# Patient Record
Sex: Male | Born: 1975
Health system: Southern US, Community
[De-identification: ages and names within clinical notes are randomized; demographics above are authoritative.]

## PROBLEM LIST (undated history)

## (undated) DIAGNOSIS — J45909 Unspecified asthma, uncomplicated: Secondary | ICD-10-CM

## (undated) DIAGNOSIS — E119 Type 2 diabetes mellitus without complications: Secondary | ICD-10-CM

## (undated) HISTORY — DX: Type 2 diabetes mellitus without complications: E11.9

---

## 2007-12-30 ENCOUNTER — Emergency Department: Payer: Self-pay | Admitting: Emergency Medicine

## 2010-01-14 ENCOUNTER — Emergency Department: Payer: Self-pay | Admitting: Emergency Medicine

## 2015-11-11 ENCOUNTER — Encounter: Payer: Self-pay | Admitting: Emergency Medicine

## 2015-11-11 ENCOUNTER — Emergency Department
Admission: EM | Admit: 2015-11-11 | Discharge: 2015-11-11 | Disposition: A | Discharge status: Home / Self Care | Payer: 59 | Attending: Student | Admitting: Student

## 2015-11-11 ENCOUNTER — Emergency Department: Payer: 59

## 2015-11-11 DIAGNOSIS — L0201 Cutaneous abscess of face: Secondary | ICD-10-CM | POA: Diagnosis not present

## 2015-11-11 DIAGNOSIS — J45909 Unspecified asthma, uncomplicated: Secondary | ICD-10-CM | POA: Diagnosis not present

## 2015-11-11 DIAGNOSIS — L03213 Periorbital cellulitis: Secondary | ICD-10-CM | POA: Insufficient documentation

## 2015-11-11 DIAGNOSIS — F17210 Nicotine dependence, cigarettes, uncomplicated: Secondary | ICD-10-CM | POA: Diagnosis not present

## 2015-11-11 DIAGNOSIS — R220 Localized swelling, mass and lump, head: Secondary | ICD-10-CM | POA: Diagnosis not present

## 2015-11-11 DIAGNOSIS — H5711 Ocular pain, right eye: Secondary | ICD-10-CM | POA: Diagnosis present

## 2015-11-11 DIAGNOSIS — H05011 Cellulitis of right orbit: Secondary | ICD-10-CM | POA: Diagnosis not present

## 2015-11-11 HISTORY — DX: Unspecified asthma, uncomplicated: J45.909

## 2015-11-11 LAB — BASIC METABOLIC PANEL
Anion gap: 4 — ABNORMAL LOW (ref 5–15)
BUN: 13 mg/dL (ref 6–20)
CALCIUM: 9.4 mg/dL (ref 8.9–10.3)
CO2: 29 mmol/L (ref 22–32)
CREATININE: 1.29 mg/dL — AB (ref 0.61–1.24)
Chloride: 103 mmol/L (ref 101–111)
GFR calc Af Amer: >60 mL/min (ref >60)
GFR calc non Af Amer: >60 mL/min (ref >60)
GLUCOSE: 82 mg/dL (ref 65–99)
Potassium: 3.9 mmol/L (ref 3.5–5.1)
Sodium: 136 mmol/L (ref 135–145)

## 2015-11-11 LAB — CBC
HEMATOCRIT: 43.1 % (ref 40.0–52.0)
Hemoglobin: 14.8 g/dL (ref 13.0–18.0)
MCH: 31.2 pg (ref 26.0–34.0)
MCHC: 34.5 g/dL (ref 32.0–36.0)
MCV: 90.4 fL (ref 80.0–100.0)
Platelets: 267 10*3/uL (ref 150–440)
RBC: 4.76 MIL/uL (ref 4.40–5.90)
RDW: 14.6 % — AB (ref 11.5–14.5)
WBC: 8.9 10*3/uL (ref 3.8–10.6)

## 2015-11-11 MED ORDER — CLINDAMYCIN HCL 150 MG PO CAPS: 300.0000 mg | ORAL_CAPSULE | Freq: Three times a day (TID) | ORAL | Status: AC

## 2015-11-11 MED ORDER — IOPAMIDOL (ISOVUE-300) INJECTION 61%
75.0000 mL | Freq: Once | INTRAVENOUS | Status: AC | PRN
  Administered 2015-11-11: 75 mL via INTRAVENOUS
  Filled 2015-11-11: qty 75

## 2015-11-11 MED ORDER — HYDROCODONE-ACETAMINOPHEN 5-325 MG PO TABS
1.0000 | ORAL_TABLET | ORAL | Status: AC
  Administered 2015-11-11: 1 via ORAL
  Filled 2015-11-11: qty 1

## 2015-11-11 MED ORDER — HYDROCODONE-ACETAMINOPHEN 5-325 MG PO TABS: 1.0000 | ORAL_TABLET | Freq: Four times a day (QID) | ORAL | Status: DC | PRN

## 2015-11-11 MED ORDER — CLINDAMYCIN PHOSPHATE 600 MG/50ML IV SOLN
600.0000 mg | Freq: Once | INTRAVENOUS | Status: AC
  Administered 2015-11-11: 600 mg via INTRAVENOUS
  Filled 2015-11-11: qty 50

## 2015-11-11 NOTE — ED Notes (Signed)
Pt presents today with swelling to R eye. Pt states swelling started on Friday. Pt states he tried to pop the cyst developing on eye lid and was successful in getting some pus out. Redness noted at this time. PT alert and oriented, no acute distress noted at this time.

## 2015-11-11 NOTE — ED Provider Notes (Signed)
CSN: [id number]     Arrival date & time 11/11/15  1250 History   First MD Initiated Contact with Patient 11/11/15 1348     Chief Complaint  Patient presents with  . Facial Swelling     (Consider location/radiation/quality/duration/timing/severity/associated sxs/prior Treatment) HPI  40 year old male presents to emergency department for evaluation of right eye pain and swelling. Patient states he developed a small pimple above his right eyelid on Friday. He noticed mild pain and swelling. Last night he noticed some pus draining from the right eyebrow. This morning pain was 7 out of 10, he had redness and swelling throughout the right eye, difficulty opening his eyelids. He denies any eyeball pain or pain with extraocular movement of the eye. He has not had any fevers. No headaches or neck pain nausea or vomiting. He has not taken any medications for pain. Denies any vision changes.  Past Medical History  Diagnosis Date  . Asthma    History reviewed. No pertinent past surgical history. History reviewed. No pertinent family history. Social History  Substance Use Topics  . Smoking status: Current Every Day Smoker -- 2.00 packs/day    Types: Cigarettes  . Smokeless tobacco: Never Used  . Alcohol Use: Yes     Comment: Occ    Review of Systems  Constitutional: Negative.  Negative for fever, chills, activity change and appetite change.  HENT: Negative for congestion, ear pain, mouth sores, rhinorrhea, sinus pressure, sore throat and trouble swallowing.   Eyes: Negative for photophobia, pain (pain around the eye, no orbital pain), discharge and visual disturbance.  Respiratory: Negative for cough, chest tightness and shortness of breath.   Cardiovascular: Negative for chest pain and leg swelling.  Gastrointestinal: Negative for nausea, vomiting, abdominal pain, diarrhea and abdominal distention.  Genitourinary: Negative for dysuria and difficulty urinating.  Musculoskeletal: Negative for  back pain, arthralgias and gait problem.  Skin: Positive for wound. Negative for color change and rash.  Neurological: Negative for dizziness and headaches.  Hematological: Negative for adenopathy.  Psychiatric/Behavioral: Negative for behavioral problems and agitation.      Allergies  Other  Home Medications   Prior to Admission medications   Medication Sig Start Date End Date Taking? Authorizing Provider  clindamycin (CLEOCIN) 150 MG capsule Take 2 capsules (300 mg total) by mouth 3 (three) times daily. 11/11/15 11/21/15  Evon Slackhomas C Gaines, PA-C  HYDROcodone-acetaminophen (NORCO) 5-325 MG tablet Take 1 tablet by mouth every 6 (six) hours as needed for moderate pain. 11/11/15   Evon Slackhomas C Gaines, PA-C   BP 146/98 mmHg  Pulse 77  Temp(Src) 98.6 F (37 C) (Oral)  Resp 16  Ht 5\' 6"  (1.676 m)  Wt 108.863 kg  BMI 38.76 kg/m2  SpO2 96% Physical Exam  Constitutional: He is oriented to person, place, and time. He appears well-developed and well-nourished.  HENT:  Head: Normocephalic and atraumatic.  Right Ear: External ear normal.  Left Ear: External ear normal.  Nose: Nose normal.  Mouth/Throat: Oropharynx is clear and moist. No oropharyngeal exudate.  Eyes: Conjunctivae and EOM are normal. Pupils are equal, round, and reactive to light. Right eye exhibits no discharge. Left eye exhibits no discharge. No scleral icterus.  Examination of the right eye shows patient has a small abscess along the right eyebrow. This is tracking down around the upper eyelid with induration. No fluctuance. There is soft tissue swelling along the lower eyelid. No drainage. Minimally tender to palpation along the eyebrow.  Neck: Normal range of motion. Neck  supple.  Cardiovascular: Normal rate, regular rhythm, normal heart sounds and intact distal pulses.   Pulmonary/Chest: Effort normal and breath sounds normal. No respiratory distress. He has no wheezes. He has no rales. He exhibits no tenderness.  Abdominal:  Soft. Bowel sounds are normal. He exhibits no distension. There is no tenderness.  Musculoskeletal: Normal range of motion. He exhibits no edema or tenderness.  Neurological: He is alert and oriented to person, place, and time.  Skin: Skin is warm and dry.  Psychiatric: He has a normal mood and affect. His behavior is normal. Judgment and thought content normal.    ED Course  Procedures (including critical care time) Scab along the right eyebrow was deroofed with a 20-gauge needle. After scab was removed, purulent drainage was expressed from the wound.  Labs Review Labs Reviewed  CBC - Abnormal; Notable for the following:    RDW 14.6 (*)    All other components within normal limits  BASIC METABOLIC PANEL - Abnormal; Notable for the following:    Creatinine, Ser 1.29 (*)    Anion gap 4 (*)    All other components within normal limits    Imaging Review Ct Orbits W/cm  11/11/2015  CLINICAL DATA:  Right eye swelling 2 days.  Popped cyst on eyelid. EXAM: CT ORBITS WITH CONTRAST TECHNIQUE: Multidetector CT imaging of the orbits was performed following the bolus administration of intravenous contrast. CONTRAST:  75mL ISOVUE-300 IOPAMIDOL (ISOVUE-300) INJECTION 61% COMPARISON:  None. FINDINGS: The globes are normal and symmetric. Retrobulbar spaces are normal. There is increased density over the right periorbital subcutaneous fat which may be due to edema versus periorbital cellulitis. No postseptal abnormality. Paranasal sinuses demonstrate hypoplastic frontal sinus with mild mucosal membrane thickening over the floor the maxillary sinuses and mucous retention cyst over the sphenoid sinus. Minimal deviation of the nasal septum to the right. Remaining bony structures are within normal. Parotid glands are normal and symmetric. Remaining soft tissues are within normal. IMPRESSION: Right periorbital soft tissue swelling which may be due to edema versus periorbital cellulitis. No evidence of postseptal  involvement. Globes and retrobulbar spaces are normal and symmetric. Minimal chronic sinus inflammatory disease. Electronically Signed   By: Elberta Fortis M.D.   On: 11/11/2015 15:39   I have personally reviewed and evaluated these images and lab results as part of my medical decision-making.   EKG Interpretation None      MDM   Final diagnoses:  Periorbital cellulitis of right eye  Abscess of face    40 year old with right periorbital cellulitis. He has a small abscess above the right eye along the eyebrow that was due to rheumatoid with a needle and purulent drainage was expressed. Patient with normal vital signs and labs. CT of the orbits confirm no orbital cellulitis. Patient is given IV clindamycin 600 mg, treated with a 10 day course of oral Clinda 300 mg 3 times a day for 10 days. He is educated on signs and symptoms to return to the emergency department for.    Evon Slack, PA-C 11/11/15 1603  Gayla Doss, MD 11/11/15 [phone]

## 2015-11-11 NOTE — ED Notes (Signed)
Discussed discharge instructions, prescriptions, and follow-up care with patient. No questions or concerns at this time. Pt stable at discharge.  

## 2015-11-11 NOTE — Discharge Instructions (Signed)
Abscess An abscess is an infected area that contains a collection of pus and debris.It can occur in almost any part of the body. An abscess is also known as a furuncle or boil. CAUSES  An abscess occurs when tissue gets infected. This can occur from blockage of oil or sweat glands, infection of hair follicles, or a minor injury to the skin. As the body tries to fight the infection, pus collects in the area and creates pressure under the skin. This pressure causes pain. People with weakened immune systems have difficulty fighting infections and get certain abscesses more often.  SYMPTOMS Usually an abscess develops on the skin and becomes a painful mass that is red, warm, and tender. If the abscess forms under the skin, you may feel a moveable soft area under the skin. Some abscesses break open (rupture) on their own, but most will continue to get worse without care. The infection can spread deeper into the body and eventually into the bloodstream, causing you to feel ill.  DIAGNOSIS  Your caregiver will take your medical history and perform a physical exam. A sample of fluid may also be taken from the abscess to determine what is causing your infection. TREATMENT  Your caregiver may prescribe antibiotic medicines to fight the infection. However, taking antibiotics alone usually does not cure an abscess. Your caregiver may need to make a small cut (incision) in the abscess to drain the pus. In some cases, gauze is packed into the abscess to reduce pain and to continue draining the area. HOME CARE INSTRUCTIONS   Only take over-the-counter or prescription medicines for pain, discomfort, or fever as directed by your caregiver.  If you were prescribed antibiotics, take them as directed. Finish them even if you start to feel better.  If gauze is used, follow your caregiver's directions for changing the gauze.  To avoid spreading the infection:  Keep your draining abscess covered with a  bandage.  Wash your hands well.  Do not share personal care items, towels, or whirlpools with others.  Avoid skin contact with others.  Keep your skin and clothes clean around the abscess.  Keep all follow-up appointments as directed by your caregiver. SEEK MEDICAL CARE IF:   You have increased pain, swelling, redness, fluid drainage, or bleeding.  You have muscle aches, chills, or a general ill feeling.  You have a fever. MAKE SURE YOU:   Understand these instructions.  Will watch your condition.  Will get help right away if you are not doing well or get worse.   This information is not intended to replace advice given to you by your health care provider. Make sure you discuss any questions you have with your health care provider.   Document Released: 05/07/2005 Document Revised: 01/27/2012 Document Reviewed: 10/10/2011 Elsevier Interactive Patient Education 2016 [employer] Preseptal Cellulitis, Adult    Preseptal cellulitis--also called periorbital cellulitis--is an infection that can affect your eyelid and the soft tissues or skin that surround your eye. The infection may also affect the structures that produce and drain your tears. It does not affect your eye itself.  CAUSES  This condition may be caused by:  Bacterial infection.  Long-term (chronic) sinus infections.  An object (foreign body) that is stuck behind the eye.  An injury that:  Goes through the eyelid tissues.  Causes an infection, such as an insect sting. Fracture of the bone around the eye.  Infections that have spread from the eyelid or other structures around the  eye.  Bite wounds.  Inflammation or infection of the lining membranes of the brain (meningitis).  An infection in the blood (septicemia).  Dental infection (abscess).  Viral infection. This is rare. RISK FACTORS  Risk factors for preseptal cellulitis include:  Participating in activities that increase your risk of trauma to the face  or head, such as boxing or high-speed activities.  Having a weakened defense system (immune system).  Medical conditions, such as nasal polyps, that increase your risk for frequent or recurrent sinus infections.  Not receiving regular dental care. SYMPTOMS  Symptoms of this condition usually come on suddenly. Symptoms may include:  Red, hot, and swollen eyelids.  Fever.  Difficulty opening your eye.  Eye pain. DIAGNOSIS  This condition may be diagnosed by an eye exam. You may also have tests, such as:  Blood tests.  CT scan.  MRI.  Spinal tap (lumbar puncture). This is a procedure that involves removing and examining a small amount of the fluid that surrounds the brain and spinal cord. This checks for meningitis. TREATMENT  Treatment for this condition will include antibiotic medicines. These may be given by mouth (orally), through an IV, or as a shot. Your health care provider may also recommend nasal decongestants to reduce swelling.  HOME CARE INSTRUCTIONS  Take your antibiotic medicine as directed by your health care provider. Finish all of it even if you start to feel better.  Take medicines only as directed by your health care provider.  Drink enough fluid to keep your urine clear or pale yellow.  Do not use any tobacco products, including cigarettes, chewing tobacco, or electronic cigarettes. If you need help quitting, ask your health care provider.  Keep all follow-up visits as directed by your health care provider. These include any visits with an eye specialist (ophthalmologist) or dentist. SEEK MEDICAL CARE IF:  You have a fever.  Your eyelids become more red, warm, or swollen.  You have new symptoms.  Your symptoms do not get better with treatment. SEEK IMMEDIATE MEDICAL CARE IF:  You develop double vision, or your vision becomes blurred or worsens in any way.  You have trouble moving your eyes.  Your eye looks like it is sticking out or bulging out (proptosis).  You  develop a severe headache, severe neck pain, or neck stiffness.  You develop repeated vomiting. This information is not intended to replace advice given to you by your health care provider. Make sure you discuss any questions you have with your health care provider.  Document Released: 08/30/2010 Document Revised: 12/12/2014 Document Reviewed: 07/24/2014  Elsevier Interactive Patient Education [employer]

## 2015-11-13 DIAGNOSIS — L03213 Periorbital cellulitis: Secondary | ICD-10-CM | POA: Diagnosis not present

## 2015-12-24 ENCOUNTER — Emergency Department: Payer: 59

## 2015-12-24 ENCOUNTER — Encounter: Payer: Self-pay | Admitting: Medical Oncology

## 2015-12-24 ENCOUNTER — Emergency Department
Admission: EM | Admit: 2015-12-24 | Discharge: 2015-12-24 | Disposition: A | Discharge status: Home / Self Care | Payer: 59 | Attending: Emergency Medicine | Admitting: Emergency Medicine

## 2015-12-24 DIAGNOSIS — F17210 Nicotine dependence, cigarettes, uncomplicated: Secondary | ICD-10-CM | POA: Diagnosis not present

## 2015-12-24 DIAGNOSIS — R05 Cough: Secondary | ICD-10-CM | POA: Diagnosis not present

## 2015-12-24 DIAGNOSIS — J9801 Acute bronchospasm: Secondary | ICD-10-CM | POA: Insufficient documentation

## 2015-12-24 MED ORDER — IPRATROPIUM-ALBUTEROL 0.5-2.5 (3) MG/3ML IN SOLN
3.0000 mL | Freq: Once | RESPIRATORY_TRACT | Status: AC
  Administered 2015-12-24: 3 mL via RESPIRATORY_TRACT
  Filled 2015-12-24: qty 3

## 2015-12-24 MED ORDER — IPRATROPIUM-ALBUTEROL 0.5-2.5 (3) MG/3ML IN SOLN
RESPIRATORY_TRACT | Status: AC
  Filled 2015-12-24: qty 3

## 2015-12-24 MED ORDER — METHYLPREDNISOLONE SODIUM SUCC 125 MG IJ SOLR
125.0000 mg | Freq: Once | INTRAMUSCULAR | Status: AC
  Administered 2015-12-24: 125 mg via INTRAMUSCULAR
  Filled 2015-12-24: qty 2

## 2015-12-24 MED ORDER — HYDROCOD POLST-CPM POLST ER 10-8 MG/5ML PO SUER
5.0000 mL | Freq: Once | ORAL | Status: AC
  Administered 2015-12-24: 5 mL via ORAL
  Filled 2015-12-24: qty 5

## 2015-12-24 MED ORDER — IPRATROPIUM-ALBUTEROL 0.5-2.5 (3) MG/3ML IN SOLN
3.0000 mL | Freq: Once | RESPIRATORY_TRACT | Status: AC
  Administered 2015-12-24: 3 mL via RESPIRATORY_TRACT

## 2015-12-24 MED ORDER — BENZONATATE 100 MG PO CAPS: 200.0000 mg | ORAL_CAPSULE | Freq: Three times a day (TID) | ORAL | Status: AC | PRN

## 2015-12-24 MED ORDER — METHYLPREDNISOLONE 4 MG PO TBPK: ORAL_TABLET | ORAL | Status: DC

## 2015-12-24 NOTE — Discharge Instructions (Signed)
Bronchospasm, Adult A bronchospasm is when the tubes that carry air in and out of your lungs (airways) spasm or tighten. During a bronchospasm it is hard to breathe. This is because the airways get smaller. A bronchospasm can be triggered by:  Allergies. These may be to animals, pollen, food, or mold.  Infection. This is a common cause of bronchospasm.  Exercise.  Irritants. These include pollution, cigarette smoke, strong odors, aerosol sprays, and paint fumes.  Weather changes.  Stress.  Being emotional. HOME CARE   Always have a plan for getting help. Know when to call your doctor and local emergency services (911 in the [place] Know where you can get emergency care.  Only take medicines as told by your doctor.  If you were prescribed an inhaler or nebulizer machine, ask your doctor how to use it correctly. Always use a spacer with your inhaler if you were given one.  Stay calm during an attack. Try to relax and breathe more slowly.  Control your home environment:  Change your heating and air conditioning filter at least once a month.  Limit your use of fireplaces and wood stoves.  Do not  smoke. Do not  allow smoking in your home.  Avoid perfumes and fragrances.  Get rid of pests (such as roaches and mice) and their droppings.  Throw away plants if you see mold on them.  Keep your house clean and dust free.  Replace carpet with wood, tile, or vinyl flooring. Carpet can trap dander and dust.  Use allergy-proof pillows, mattress covers, and box spring covers.  Wash bed sheets and blankets every week in hot water. Dry them in a dryer.  Use blankets that are made of polyester or cotton.  Wash hands frequently. GET HELP IF:  You have muscle aches.  You have chest pain.  The thick spit you spit or cough up (sputum) changes from clear or white to yellow, green, gray, or bloody.  The thick spit you spit or cough up gets thicker.  There are problems that may be  related to the medicine you are given such as:  A rash.  Itching.  Swelling.  Trouble breathing. GET HELP RIGHT AWAY IF:  You feel you cannot breathe or catch your breath.  You cannot stop coughing.  Your treatment is not helping you breathe better.  You have very bad chest pain. MAKE SURE YOU:   Understand these instructions.  Will watch your condition.  Will get help right away if you are not doing well or get worse.   This information is not intended to replace advice given to you by your health care provider. Make sure you discuss any questions you have with your health care provider.   Document Released: 05/25/2009 Document Revised: 08/18/2014 Document Reviewed: 01/18/2013 Elsevier Interactive Patient Education 2016 Elsevier Inc.  

## 2015-12-24 NOTE — ED Notes (Signed)
Pt reports cough, congestion and wheezing x 1 week. Hx of asthma, inhalers and nebs not working at home.

## 2015-12-24 NOTE — ED Notes (Signed)
States she developed cough and some wheezing for couple of days  Has used inhaler and SVN at home w/o relief  Denies any fever or chills

## 2015-12-24 NOTE — ED Provider Notes (Signed)
[facility] Emergency Department Provider Note   ____________________________________________  Time seen: Approximately 3:00 PM  I have reviewed the triage vital signs and the nursing notes.   HISTORY  Chief Complaint Cough and Asthma    HPI Buford DresserJermaine Gilland is a 40 y.o. male patient complaining of cough and wheezing for one week. Basic history of asthma which has not been relieved by his inhaler. Patient state he uses child's nebulizer machine and doses last night with only mild relief. Patient denies any other URI signs symptoms. Patient denies any nausea vomiting diarrhea.Patient was given nebulized treatment in triage which has not improved his complaint.   Past Medical History  Diagnosis Date  . Asthma     There are no active problems to display for this patient.   History reviewed. No pertinent past surgical history.  Current Outpatient Rx  Name  Route  Sig  Dispense  Refill  . albuterol (ACCUNEB) 1.25 MG/3ML nebulizer solution   Nebulization   Take 1 ampule by nebulization every 6 (six) hours as needed for wheezing.         Marland Kitchen. albuterol (PROVENTIL HFA;VENTOLIN HFA) 108 (90 Base) MCG/ACT inhaler   Inhalation   Inhale 2 puffs into the lungs every 6 (six) hours as needed for wheezing or shortness of breath.         . benzonatate (TESSALON PERLES) 100 MG capsule   Oral   Take 2 capsules (200 mg total) by mouth 3 (three) times daily as needed for cough.   30 capsule   0   . HYDROcodone-acetaminophen (NORCO) 5-325 MG tablet   Oral   Take 1 tablet by mouth every 6 (six) hours as needed for moderate pain.   15 tablet   0   . methylPREDNISolone (MEDROL DOSEPAK) 4 MG TBPK tablet      Take Tapered dose as directed   21 tablet   0     Allergies Other  No family history on file.  Social History Social History  Substance Use Topics  . Smoking status: Current Every Day Smoker -- 2.00 packs/day    Types: Cigarettes  .  Smokeless tobacco: Never Used  . Alcohol Use: Yes     Comment: Occ    Review of Systems Constitutional: No fever/chills Eyes: No visual changes. ENT: No sore throat. Cardiovascular: Denies chest pain. Respiratory: Denies shortness of breath.Cough and wheezing Gastrointestinal: No abdominal pain.  No nausea, no vomiting.  No diarrhea.  No constipation. Genitourinary: Negative for dysuria. Musculoskeletal: Negative for back pain. Skin: Negative for rash. Neurological: Negative for headaches, focal weakness or numbness.    ____________________________________________   PHYSICAL EXAM:  VITAL SIGNS: ED Triage Vitals  Enc Vitals Group     BP 12/24/15 1224 157/95 mmHg     Pulse Rate 12/24/15 1224 112     Resp 12/24/15 1224 20     Temp 12/24/15 1224 98.1 F (36.7 C)     Temp Source 12/24/15 1224 Oral     SpO2 12/24/15 1224 95 %     Weight 12/24/15 1224 240 lb (108.863 kg)     Height 12/24/15 1224 5\' 6"  (1.676 m)     Head Cir --      Peak Flow --      Pain Score 12/24/15 1224 0     Pain Loc --      Pain Edu? --      Excl. in GC? --     Constitutional: Alert and  oriented. Well appearing and in no acute distress. Eyes: Conjunctivae are normal. PERRL. EOMI. Head: Atraumatic. Nose: No congestion/rhinnorhea. Mouth/Throat: Mucous membranes are moist.  Oropharynx non-erythematous. Neck: No stridor. No cervical spine tenderness to palpation. Hematological/Lymphatic/Immunilogical: No cervical lymphadenopathy. Cardiovascular: Normal rate, regular rhythm. Grossly normal heart sounds.  Good peripheral circulation.Tachycardic Respiratory: Normal respiratory effort.  No retractions. Lungs bilateral expiratory wheezing Gastrointestinal: Soft and nontender. No distention. No abdominal bruits. No CVA tenderness. Musculoskeletal: No lower extremity tenderness nor edema.  No joint effusions. Neurologic:  Normal speech and language. No gross focal neurologic deficits are appreciated. No  gait instability. Skin:  Skin is warm, dry and intact. No rash noted. Psychiatric: Mood and affect are normal. Speech and behavior are normal.  ____________________________________________   LABS (all labs ordered are listed, but only abnormal results are displayed)  Labs Reviewed - No data to display ____________________________________________  EKG  Rib heart station Dr. no acute findings.  ____________________________________________  RADIOLOGY  No acute findings on chest x-ray ____________________________________________   PROCEDURES  Procedure(s) performed: None  Critical Care performed: No  ____________________________________________   INITIAL IMPRESSION / ASSESSMENT AND PLAN / ED COURSE  Pertinent labs & imaging results that were available during my care of the patient were reviewed by me and considered in my medical decision making (see chart for details).  Cough due to bronchospasm. Patient given discharge care instructions. Patient given prescription for Motrin dosepak and Tessalon Perles. Patient [facility]. ____________________________________________   FINAL CLINICAL IMPRESSION(S) / ED DIAGNOSES  Final diagnoses:  Cough due to bronchospasm      NEW MEDICATIONS STARTED DURING THIS VISIT:  New Prescriptions   BENZONATATE (TESSALON PERLES) 100 MG CAPSULE    Take 2 capsules (200 mg total) by mouth 3 (three) times daily as needed for cough.   METHYLPREDNISOLONE (MEDROL DOSEPAK) 4 MG TBPK TABLET    Take Tapered dose as directed     Note:  This document was prepared using Dragon voice recognition software and may include unintentional dictation errors.    Joni Reining, PA-C 12/24/15 1555  Joni Reining, PA-C 12/24/15 1556  Phineas Semen, MD 12/24/15 [phone]

## 2017-10-23 ENCOUNTER — Emergency Department: Payer: 59

## 2017-10-23 ENCOUNTER — Emergency Department
Admission: EM | Admit: 2017-10-23 | Discharge: 2017-10-23 | Disposition: A | Discharge status: Home / Self Care | Payer: 59 | Attending: Emergency Medicine | Admitting: Emergency Medicine

## 2017-10-23 ENCOUNTER — Encounter: Payer: Self-pay | Admitting: Emergency Medicine

## 2017-10-23 ENCOUNTER — Other Ambulatory Visit: Payer: Self-pay

## 2017-10-23 DIAGNOSIS — J9801 Acute bronchospasm: Secondary | ICD-10-CM | POA: Diagnosis not present

## 2017-10-23 DIAGNOSIS — F17210 Nicotine dependence, cigarettes, uncomplicated: Secondary | ICD-10-CM | POA: Diagnosis not present

## 2017-10-23 DIAGNOSIS — R05 Cough: Secondary | ICD-10-CM | POA: Diagnosis not present

## 2017-10-23 MED ORDER — BENZONATATE 100 MG PO CAPS: 200.0000 mg | ORAL_CAPSULE | Freq: Three times a day (TID) | ORAL | 0 refills | Status: AC | PRN

## 2017-10-23 MED ORDER — METHYLPREDNISOLONE 4 MG PO TBPK: ORAL_TABLET | ORAL | 0 refills | Status: DC

## 2017-10-23 MED ORDER — NAPROXEN 500 MG PO TABS
500.0000 mg | ORAL_TABLET | Freq: Once | ORAL | Status: AC
  Administered 2017-10-23: 500 mg via ORAL
  Filled 2017-10-23: qty 1

## 2017-10-23 MED ORDER — HYDROCOD POLST-CPM POLST ER 10-8 MG/5ML PO SUER
5.0000 mL | Freq: Once | ORAL | Status: AC
  Administered 2017-10-23: 5 mL via ORAL
  Filled 2017-10-23: qty 5

## 2017-10-23 MED ORDER — HYDROCOD POLST-CPM POLST ER 10-8 MG/5ML PO SUER: 5.0000 mL | Freq: Every evening | ORAL | 0 refills | Status: DC | PRN

## 2017-10-23 NOTE — ED Triage Notes (Addendum)
Presents with cough for couple of days  States cough is prod now  Coughing up yellow/green phlegm   Denies any fever  Having increased pain in chest pain

## 2017-10-23 NOTE — ED Provider Notes (Signed)
[facility] Emergency Department Provider Note   ____________________________________________   First MD Initiated Contact with Patient 10/23/17 [phone]     (approximate)  I have reviewed the triage vital signs and the nursing notes.   HISTORY  Chief Complaint Cough    HPI Austin Serrano is a 42 y.o. male patient complaining of productive cough for 3 days.  Patient state yellow greenish sputum.  Patient state no relief from cough and wheezing with inhaler.  Patient denies nausea, vomiting, diarrhea.  Patient state body secondary to continual cough.  Patient has taken a flu shot this season.  Patient rates his pain discomfort as a 9/10.  Patient describes his pain is "aching".  Past Medical History:  Diagnosis Date  . Asthma     There are no active problems to display for this patient.   History reviewed. No pertinent surgical history.  Prior to Admission medications   Medication Sig Start Date End Date Taking? Authorizing Provider  albuterol (ACCUNEB) 1.25 MG/3ML nebulizer solution Take 1 ampule by nebulization every 6 (six) hours as needed for wheezing.    [provider]  albuterol (PROVENTIL HFA;VENTOLIN HFA) 108 (90 Base) MCG/ACT inhaler Inhale 2 puffs into the lungs every 6 (six) hours as needed for wheezing or shortness of breath.    [provider]  benzonatate (TESSALON PERLES) 100 MG capsule Take 2 capsules (200 mg total) by mouth 3 (three) times daily as needed. 10/23/17 10/23/18  Joni ReiningSmith, Tyja Gortney K, PA-C  chlorpheniramine-HYDROcodone (TUSSIONEX PENNKINETIC ER) 10-8 MG/5ML SUER Take 5 mLs by mouth at bedtime as needed for cough. 10/23/17   Joni ReiningSmith, Maryruth Apple K, PA-C  HYDROcodone-acetaminophen (NORCO) 5-325 MG tablet Take 1 tablet by mouth every 6 (six) hours as needed for moderate pain. 11/11/15   Evon SlackGaines, Thomas C, PA-C  methylPREDNISolone (MEDROL DOSEPAK) 4 MG TBPK tablet Take Tapered dose as directed 12/24/15   Joni ReiningSmith, Nollie Terlizzi K, PA-C    methylPREDNISolone (MEDROL DOSEPAK) 4 MG TBPK tablet Take Tapered dose as directed 10/23/17   Joni ReiningSmith, Rayshard Schirtzinger K, PA-C    Allergies Other  No family history on file.  Social History Social History   Tobacco Use  . Smoking status: Current Every Day Smoker    Packs/day: 2.00    Types: Cigarettes  . Smokeless tobacco: Never Used  Substance Use Topics  . Alcohol use: Yes    Comment: Occ  . Drug use: No    Review of Systems Constitutional: No fever/chills Eyes: No visual changes. ENT: No sore throat. Cardiovascular: Denies chest pain. Respiratory: Wheezing, shortness of breath, and productive cough. Gastrointestinal: No abdominal pain.  No nausea, no vomiting.  No diarrhea.  No constipation. Genitourinary: Negative for dysuria. Musculoskeletal: Negative for back pain. Skin: Negative for rash. Neurological: Negative for headaches, focal weakness or numbness.   ____________________________________________   PHYSICAL EXAM:  VITAL SIGNS: ED Triage Vitals  Enc Vitals Group     BP 10/23/17 0804 111/80     Pulse Rate 10/23/17 0804 100     Resp 10/23/17 0804 16     Temp --      Temp Source 10/23/17 0804 Oral     SpO2 10/23/17 0804 99 %     Weight 10/23/17 0806 240 lb (108.9 kg)     Height 10/23/17 0806 5\' 7"  (1.702 m)     Head Circumference --      Peak Flow --      Pain Score 10/23/17 0805 10     Pain Loc --  Pain Edu? --      Excl. in GC? --    Constitutional: Alert and oriented. Well appearing and in no acute distress. Nose: Nasal congestion and rhinorrhea. Mouth/Throat: Mucous membranes are moist.  Oropharynx non-erythematous. Neck: No stridor. Hematological/Lymphatic/Immunilogical: No cervical lymphadenopathy. Cardiovascular: Normal rate, regular rhythm. Grossly normal heart sounds.  Good peripheral circulation. Respiratory: Normal respiratory effort.  No retractions. Lungs with rhonch/wheezing, i and continual productive cough.  Musculoskeletal: No lower  extremity tenderness nor edema.  No joint effusions. Neurologic:  Normal speech and language. No gross focal neurologic deficits are appreciated. No gait instability. Skin:  Skin is warm, dry and intact. No rash noted. Psychiatric: Mood and affect are normal. Speech and behavior are normal.  ____________________________________________   LABS (all labs ordered are listed, but only abnormal results are displayed)  Labs Reviewed - No data to display ____________________________________________  EKG   ____________________________________________  RADIOLOGY No acute findings on chest x-ray   Official radiology report(s): Dg Chest 2 View  Result Date: 10/23/2017 CLINICAL DATA:  Productive cough. EXAM: CHEST - 2 VIEW COMPARISON:  12/24/2015 FINDINGS: The heart size and mediastinal contours are within normal limits. Both lungs are clear. The visualized skeletal structures are unremarkable. IMPRESSION: No active cardiopulmonary disease. Electronically Signed   By: Marlan Palau M.D.   On: 10/23/2017 08:56    ____________________________________________   PROCEDURES  Procedure(s) performed: None  Procedures  Critical Care performed: No  ____________________________________________   INITIAL IMPRESSION / ASSESSMENT AND PLAN / ED COURSE  As part of my medical decision making, I reviewed the following data within the electronic MEDICAL RECORD NUMBER    Cough secondary to bronchospasms.  Discussed negative x-ray findings with patient.  Patient given discharge care instruction advised take medication as directed.  Patient advised to follow-up with PCP if as needed.      ____________________________________________   FINAL CLINICAL IMPRESSION(S) / ED DIAGNOSES  Final diagnoses:  Cough due to bronchospasm     ED Discharge Orders        Ordered    chlorpheniramine-HYDROcodone (TUSSIONEX PENNKINETIC ER) 10-8 MG/5ML SUER  At bedtime PRN     10/23/17 0908    benzonatate  (TESSALON PERLES) 100 MG capsule  3 times daily PRN     10/23/17 0908    methylPREDNISolone (MEDROL DOSEPAK) 4 MG TBPK tablet     10/23/17 0908       Note:  This document was prepared using Dragon voice recognition software and may include unintentional dictation errors.    Joni Reining, PA-C 10/23/17 4132    Jene Every, MD 10/23/17 1257

## 2018-04-02 ENCOUNTER — Ambulatory Visit: Payer: Self-pay | Admitting: Family Medicine

## 2018-08-11 DIAGNOSIS — R319 Hematuria, unspecified: Secondary | ICD-10-CM | POA: Diagnosis not present

## 2018-08-11 DIAGNOSIS — N390 Urinary tract infection, site not specified: Secondary | ICD-10-CM | POA: Diagnosis not present

## 2018-08-14 ENCOUNTER — Encounter: Payer: Self-pay | Admitting: Emergency Medicine

## 2018-08-14 DIAGNOSIS — R319 Hematuria, unspecified: Secondary | ICD-10-CM | POA: Diagnosis not present

## 2018-08-14 DIAGNOSIS — Z5321 Procedure and treatment not carried out due to patient leaving prior to being seen by health care provider: Secondary | ICD-10-CM | POA: Insufficient documentation

## 2018-08-14 DIAGNOSIS — F17210 Nicotine dependence, cigarettes, uncomplicated: Secondary | ICD-10-CM | POA: Diagnosis not present

## 2018-08-14 DIAGNOSIS — Z79899 Other long term (current) drug therapy: Secondary | ICD-10-CM | POA: Insufficient documentation

## 2018-08-14 DIAGNOSIS — N390 Urinary tract infection, site not specified: Secondary | ICD-10-CM | POA: Diagnosis not present

## 2018-08-14 DIAGNOSIS — J45909 Unspecified asthma, uncomplicated: Secondary | ICD-10-CM | POA: Insufficient documentation

## 2018-08-14 DIAGNOSIS — M545 Low back pain: Secondary | ICD-10-CM | POA: Diagnosis not present

## 2018-08-14 DIAGNOSIS — R309 Painful micturition, unspecified: Secondary | ICD-10-CM | POA: Diagnosis present

## 2018-08-14 DIAGNOSIS — R310 Gross hematuria: Secondary | ICD-10-CM | POA: Diagnosis not present

## 2018-08-14 LAB — URINALYSIS, COMPLETE (UACMP) WITH MICROSCOPIC
BACTERIA UA: NONE SEEN
Bilirubin Urine: NEGATIVE
Glucose, UA: NEGATIVE mg/dL
Ketones, ur: NEGATIVE mg/dL
Nitrite: NEGATIVE
Protein, ur: 100 mg/dL — AB
SPECIFIC GRAVITY, URINE: 1.02 (ref 1.005–1.030)
SQUAMOUS EPITHELIAL / LPF: NONE SEEN (ref 0–5)
pH: 6 (ref 5.0–8.0)

## 2018-08-14 LAB — BASIC METABOLIC PANEL
ANION GAP: 6 (ref 5–15)
BUN: 14 mg/dL (ref 6–20)
CALCIUM: 8.7 mg/dL — AB (ref 8.9–10.3)
CHLORIDE: 102 mmol/L (ref 98–111)
CO2: 27 mmol/L (ref 22–32)
Creatinine, Ser: 1.33 mg/dL — ABNORMAL HIGH (ref 0.61–1.24)
GLUCOSE: 176 mg/dL — AB (ref 70–99)
POTASSIUM: 3.9 mmol/L (ref 3.5–5.1)
SODIUM: 135 mmol/L (ref 135–145)

## 2018-08-14 LAB — CBC
HCT: 42.1 % (ref 39.0–52.0)
Hemoglobin: 13.9 g/dL (ref 13.0–17.0)
MCH: 29.8 pg (ref 26.0–34.0)
MCHC: 33 g/dL (ref 30.0–36.0)
MCV: 90.1 fL (ref 80.0–100.0)
PLATELETS: 326 10*3/uL (ref 150–400)
RBC: 4.67 MIL/uL (ref 4.22–5.81)
RDW: 14 % (ref 11.5–15.5)
WBC: 10 10*3/uL (ref 4.0–10.5)
nRBC: 0 % (ref 0.0–0.2)

## 2018-08-14 NOTE — ED Notes (Signed)
Patient up to stat desk inquiring about wait time, update given. 

## 2018-08-14 NOTE — ED Triage Notes (Signed)
Patient states that he started having pain with urination on 12/28 and [facility] and diagnosed with a UTI. Patient states that since that time he has had increase pain with urination and blood in his urine.

## 2018-08-15 ENCOUNTER — Emergency Department
Admission: EM | Admit: 2018-08-15 | Discharge: 2018-08-15 | Disposition: A | Discharge status: Home / Self Care | Payer: 59 | Attending: Emergency Medicine | Admitting: Emergency Medicine

## 2018-08-15 ENCOUNTER — Emergency Department
Admission: EM | Admit: 2018-08-15 | Discharge: 2018-08-15 | Disposition: A | Discharge status: Home / Self Care | Payer: 59 | Source: Home / Self Care | Attending: Emergency Medicine | Admitting: Emergency Medicine

## 2018-08-15 ENCOUNTER — Emergency Department: Payer: 59

## 2018-08-15 ENCOUNTER — Other Ambulatory Visit: Payer: Self-pay

## 2018-08-15 DIAGNOSIS — R319 Hematuria, unspecified: Secondary | ICD-10-CM | POA: Diagnosis not present

## 2018-08-15 DIAGNOSIS — Z79899 Other long term (current) drug therapy: Secondary | ICD-10-CM

## 2018-08-15 DIAGNOSIS — J45909 Unspecified asthma, uncomplicated: Secondary | ICD-10-CM

## 2018-08-15 DIAGNOSIS — N390 Urinary tract infection, site not specified: Secondary | ICD-10-CM

## 2018-08-15 DIAGNOSIS — F17210 Nicotine dependence, cigarettes, uncomplicated: Secondary | ICD-10-CM

## 2018-08-15 DIAGNOSIS — R310 Gross hematuria: Secondary | ICD-10-CM | POA: Diagnosis not present

## 2018-08-15 DIAGNOSIS — M545 Low back pain: Secondary | ICD-10-CM | POA: Diagnosis not present

## 2018-08-15 DIAGNOSIS — Z5321 Procedure and treatment not carried out due to patient leaving prior to being seen by health care provider: Secondary | ICD-10-CM | POA: Diagnosis not present

## 2018-08-15 LAB — URINALYSIS, COMPLETE (UACMP) WITH MICROSCOPIC
BACTERIA UA: NONE SEEN
BILIRUBIN URINE: NEGATIVE
Glucose, UA: NEGATIVE mg/dL
KETONES UR: NEGATIVE mg/dL
NITRITE: NEGATIVE
PH: 5 (ref 5.0–8.0)
Protein, ur: 30 mg/dL — AB
RBC / HPF: >50 RBC/hpf — ABNORMAL HIGH (ref 0–5)
SPECIFIC GRAVITY, URINE: 1.018 (ref 1.005–1.030)
Squamous Epithelial / LPF: NONE SEEN (ref 0–5)

## 2018-08-15 LAB — CBC WITH DIFFERENTIAL/PLATELET
ABS IMMATURE GRANULOCYTES: 0.06 10*3/uL (ref 0.00–0.07)
Basophils Absolute: 0 10*3/uL (ref 0.0–0.1)
Basophils Relative: 0 %
Eosinophils Absolute: 0.2 10*3/uL (ref 0.0–0.5)
Eosinophils Relative: 3 %
HCT: 40.6 % (ref 39.0–52.0)
Hemoglobin: 13.1 g/dL (ref 13.0–17.0)
Immature Granulocytes: 1 %
Lymphocytes Relative: 36 %
Lymphs Abs: 2.5 10*3/uL (ref 0.7–4.0)
MCH: 29.6 pg (ref 26.0–34.0)
MCHC: 32.3 g/dL (ref 30.0–36.0)
MCV: 91.6 fL (ref 80.0–100.0)
Monocytes Absolute: 0.6 10*3/uL (ref 0.1–1.0)
Monocytes Relative: 9 %
NEUTROS ABS: 3.5 10*3/uL (ref 1.7–7.7)
NEUTROS PCT: 51 %
NRBC: 0 % (ref 0.0–0.2)
Platelets: 284 10*3/uL (ref 150–400)
RBC: 4.43 MIL/uL (ref 4.22–5.81)
RDW: 14.2 % (ref 11.5–15.5)
WBC: 6.9 10*3/uL (ref 4.0–10.5)

## 2018-08-15 LAB — COMPREHENSIVE METABOLIC PANEL
ALT: 23 U/L (ref 0–44)
AST: 24 U/L (ref 15–41)
Albumin: 4 g/dL (ref 3.5–5.0)
Alkaline Phosphatase: 51 U/L (ref 38–126)
Anion gap: 6 (ref 5–15)
BUN: 11 mg/dL (ref 6–20)
CO2: 26 mmol/L (ref 22–32)
Calcium: 8.6 mg/dL — ABNORMAL LOW (ref 8.9–10.3)
Chloride: 106 mmol/L (ref 98–111)
Creatinine, Ser: 1.17 mg/dL (ref 0.61–1.24)
GFR calc Af Amer: >60 mL/min (ref >60)
GFR calc non Af Amer: >60 mL/min (ref >60)
Glucose, Bld: 71 mg/dL (ref 70–99)
POTASSIUM: 4 mmol/L (ref 3.5–5.1)
SODIUM: 138 mmol/L (ref 135–145)
Total Bilirubin: 0.5 mg/dL (ref 0.3–1.2)
Total Protein: 7.3 g/dL (ref 6.5–8.1)

## 2018-08-15 MED ORDER — SODIUM CHLORIDE 0.9 % IV BOLUS
1000.0000 mL | Freq: Once | INTRAVENOUS | Status: AC
  Administered 2018-08-15: 1000 mL via INTRAVENOUS

## 2018-08-15 MED ORDER — KETOROLAC TROMETHAMINE 30 MG/ML IJ SOLN
30.0000 mg | Freq: Once | INTRAMUSCULAR | Status: AC
  Administered 2018-08-15: 30 mg via INTRAVENOUS
  Filled 2018-08-15: qty 1

## 2018-08-15 NOTE — ED Provider Notes (Signed)
[facility] Emergency Department Provider Note  ____________________________________________   First MD Initiated Contact with Patient 08/15/18 1225     (approximate)  I have reviewed the triage vital signs and the nursing notes.   HISTORY  Chief Complaint Hematuria   HPI Buford DresserJermaine Bilodeau is a 43 y.o. male patient presents today with complaint of hematuria.  He checked into the ED last evening but left before being seen.  He states that he has had hematuria with terminal dysuria at the end of his stream.  He is unaware of any history of kidney stones but has had some back pain with the hematuria.  He denies any fever or chills.  He was seen earlier in the week at an urgent care where he was diagnosed with having a UTI.  He currently takes antibiotics.  He denies any previous UTIs.  There is been no nausea, vomiting, fever or chills.  Patient also denies any penile discharge.   Past Medical History:  Diagnosis Date  . Asthma     There are no active problems to display for this patient.   History reviewed. No pertinent surgical history.  Prior to Admission medications   Medication Sig Start Date End Date Taking? Authorizing Provider  albuterol (ACCUNEB) 1.25 MG/3ML nebulizer solution Take 1 ampule by nebulization every 6 (six) hours as needed for wheezing.    [provider]  albuterol (PROVENTIL HFA;VENTOLIN HFA) 108 (90 Base) MCG/ACT inhaler Inhale 2 puffs into the lungs every 6 (six) hours as needed for wheezing or shortness of breath.    [provider]  benzonatate (TESSALON PERLES) 100 MG capsule Take 2 capsules (200 mg total) by mouth 3 (three) times daily as needed. 10/23/17 10/23/18  Joni ReiningSmith, Ronald K, PA-C  chlorpheniramine-HYDROcodone (TUSSIONEX PENNKINETIC ER) 10-8 MG/5ML SUER Take 5 mLs by mouth at bedtime as needed for cough. 10/23/17   Joni ReiningSmith, Ronald K, PA-C  HYDROcodone-acetaminophen (NORCO) 5-325 MG tablet Take 1 tablet by mouth  every 6 (six) hours as needed for moderate pain. 11/11/15   Evon SlackGaines, Thomas C, PA-C  methylPREDNISolone (MEDROL DOSEPAK) 4 MG TBPK tablet Take Tapered dose as directed 12/24/15   Joni ReiningSmith, Ronald K, PA-C  methylPREDNISolone (MEDROL DOSEPAK) 4 MG TBPK tablet Take Tapered dose as directed 10/23/17   Joni ReiningSmith, Ronald K, PA-C    Allergies Other  History reviewed. No pertinent family history.  Social History Social History   Tobacco Use  . Smoking status: Current Every Day Smoker    Packs/day: 2.00    Types: Cigarettes  . Smokeless tobacco: Never Used  Substance Use Topics  . Alcohol use: Yes    Comment: Occ  . Drug use: No    Review of Systems Constitutional: No fever/chills Cardiovascular: Denies chest pain. Respiratory: Denies shortness of breath. Gastrointestinal: No abdominal pain.  No nausea, no vomiting.  Genitourinary: Positive for hematuria. Musculoskeletal: Positive for low back pain. Skin: Negative for rash. Neurological: Negative for headaches, focal weakness or numbness. ____________________________________________   PHYSICAL EXAM:  VITAL SIGNS: ED Triage Vitals  Enc Vitals Group     BP 08/15/18 1209 122/84     Pulse Rate 08/15/18 1209 84     Resp 08/15/18 1209 18     Temp 08/15/18 1209 97.8 F (36.6 C)     Temp Source 08/15/18 1209 Oral     SpO2 08/15/18 1209 96 %     Weight 08/15/18 1210 261 lb 3.2 oz (118.5 kg)     Height 08/15/18 1210  5\' 6"  (1.676 m)     Head Circumference --      Peak Flow --      Pain Score 08/15/18 1210 4     Pain Loc --      Pain Edu? --      Excl. in GC? --     Constitutional: Alert and oriented. Well appearing and in no acute distress. Eyes: Conjunctivae are normal.  Head: Atraumatic. Neck: No stridor.   Cardiovascular: Normal rate, regular rhythm. Grossly normal heart sounds.  Good peripheral circulation. Respiratory: Normal respiratory effort.  No retractions. Lungs CTAB. Gastrointestinal: Soft and nontender. No distention. No  CVA tenderness. Musculoskeletal: No lower extremity tenderness nor edema.  No joint effusions. Neurologic:  Normal speech and language. No gross focal neurologic deficits are appreciated. No gait instability. Skin:  Skin is warm, dry and intact. No rash noted. Psychiatric: Mood and affect are normal. Speech and behavior are normal.  ____________________________________________   LABS (all labs ordered are listed, but only abnormal results are displayed)  Labs Reviewed  URINALYSIS, COMPLETE (UACMP) WITH MICROSCOPIC - Abnormal; Notable for the following components:      Result Value   Color, Urine YELLOW (*)    APPearance CLEAR (*)    Hgb urine dipstick LARGE (*)    Protein, ur 30 (*)    Leukocytes, UA SMALL (*)    RBC / HPF >50 (*)    All other components within normal limits  COMPREHENSIVE METABOLIC PANEL - Abnormal; Notable for the following components:   Calcium 8.6 (*)    All other components within normal limits  CBC WITH DIFFERENTIAL/PLATELET  CBC WITH DIFFERENTIAL/PLATELET   RADIOLOGY   Official radiology report(s): Ct Renal Stone Study  Result Date: 08/15/2018 CLINICAL DATA:  Gross hematuria for 6 days with bilateral flank pain. EXAM: CT ABDOMEN AND PELVIS WITHOUT CONTRAST TECHNIQUE: Multidetector CT imaging of the abdomen and pelvis was performed following the standard protocol without IV contrast. COMPARISON:  None FINDINGS: Lower chest: No acute abnormality. Hepatobiliary: No focal liver abnormality. The gallbladder appears normal. No biliary dilatation. Pancreas: Unremarkable. No pancreatic ductal dilatation or surrounding inflammatory changes. Spleen: Normal in size without focal abnormality. Adrenals/Urinary Tract: Adrenal glands are unremarkable. Kidneys are normal, without renal calculi, focal lesion, or hydronephrosis. Bladder is unremarkable. Stomach/Bowel: Stomach is within normal limits. Appendix appears normal. No evidence of bowel wall thickening, distention, or  inflammatory changes. Vascular/Lymphatic: No significant vascular findings are present. No enlarged abdominal or pelvic lymph nodes. Reproductive: Uterus and bilateral adnexa are unremarkable. Other: No abdominal wall hernia or abnormality. No abdominopelvic ascites. Musculoskeletal: No acute or significant osseous findings. IMPRESSION: 1. No acute findings. 2. No kidney stones or hydronephrosis. Electronically Signed   By: Signa Kellaylor  Stroud M.D.   On: 08/15/2018 13:39  ____________________________________________   PROCEDURES  Procedure(s) performed: None  Procedures  Critical Care performed: No  ____________________________________________   INITIAL IMPRESSION / ASSESSMENT AND PLAN / ED COURSE  As part of my medical decision making, I reviewed the following data within the electronic MEDICAL RECORD NUMBER Notes from prior ED visits and [place] Controlled Substance Database  43 year old male presents to the ED with complaint of hematuria.  Patient was given Toradol IV and fluids after looking at his initial lab work last evening in which his creatinine was elevated.  Patient states that he was not drinking much fluids yesterday as he had pain.  He currently reports that he is not having any back pain or discomfort at all.  He denies any previous urinary problems and currently is taking Bactrim DS for a diagnosed UTI from an acute care.  Labs were reassuring.  Patient CT scan did not show kidney stone as was expected.  It is uncertain if patient has already passed the stone causing the problem or if there are other issues.  He is strongly advised to follow-up with urology for further evaluation of his hematuria.  Patient was discharged in improved condition.  He is to return to the emergency department if any worsening of his symptoms.  ____________________________________________   FINAL CLINICAL IMPRESSION(S) / ED DIAGNOSES  Final diagnoses:  Urinary tract infection with hematuria, site unspecified    Hematuria, unspecified type     ED Discharge Orders    None       Note:  This document was prepared using Dragon voice recognition software and may include unintentional dictation errors.    Tommi Rumps, PA-C 08/15/18 1658    Minna Antis, MD 08/16/18 1428

## 2018-08-15 NOTE — Discharge Instructions (Signed)
Call make an appointment with Dr. Lonna Cobb who is the urologist on call.  His contact information is listed on your discharge papers.  Continue taking antibiotic until completely finished.  Increase fluids.

## 2018-08-15 NOTE — ED Notes (Signed)
Per Dr. Cyril Loosen no repeat blood work or urine since performed last night.

## 2018-08-15 NOTE — ED Triage Notes (Signed)
Pt states hematuria. Being treated for UTI, started antibiotics on 1/1. Seen here last night and LWBS. No distress noted.

## 2018-08-25 ENCOUNTER — Encounter: Payer: Self-pay | Admitting: Urology

## 2018-08-25 ENCOUNTER — Ambulatory Visit: Payer: Self-pay | Admitting: Urology

## 2018-09-09 ENCOUNTER — Ambulatory Visit: Payer: Self-pay | Admitting: Urology

## 2019-02-23 DIAGNOSIS — S30860A Insect bite (nonvenomous) of lower back and pelvis, initial encounter: Secondary | ICD-10-CM | POA: Diagnosis not present

## 2019-02-23 DIAGNOSIS — R062 Wheezing: Secondary | ICD-10-CM | POA: Diagnosis not present

## 2019-02-23 DIAGNOSIS — R112 Nausea with vomiting, unspecified: Secondary | ICD-10-CM | POA: Diagnosis not present

## 2019-02-23 DIAGNOSIS — R197 Diarrhea, unspecified: Secondary | ICD-10-CM | POA: Diagnosis not present

## 2019-05-04 IMAGING — CT CT RENAL STONE PROTOCOL
2 of 4 series · 17 of 46 positions shown, 19 images · non-contrast
Comparison: None

CLINICAL DATA: Gross hematuria for 6 days with bilateral flank
pain.

EXAM:
CT ABDOMEN AND PELVIS WITHOUT CONTRAST
TECHNIQUE: Multidetector CT imaging of the abdomen and pelvis was performed
following the standard protocol without IV contrast.

[Series 2: stone full standard · axial · 0.71mm/px · z∈[-1,+384]mm · 14 of 85 slices shown, 16 images]
[im 4/85  soft-tissue]
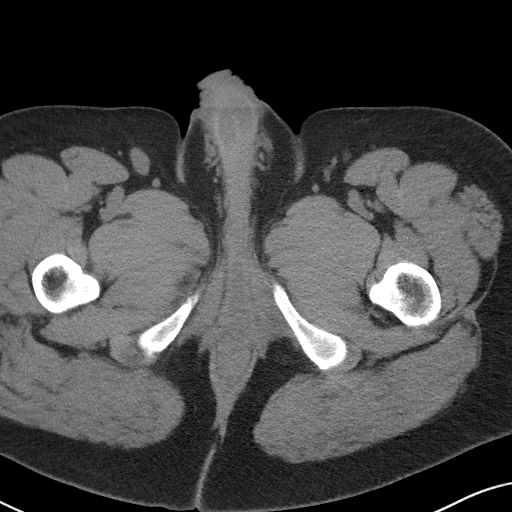
[im 4/85  bone]
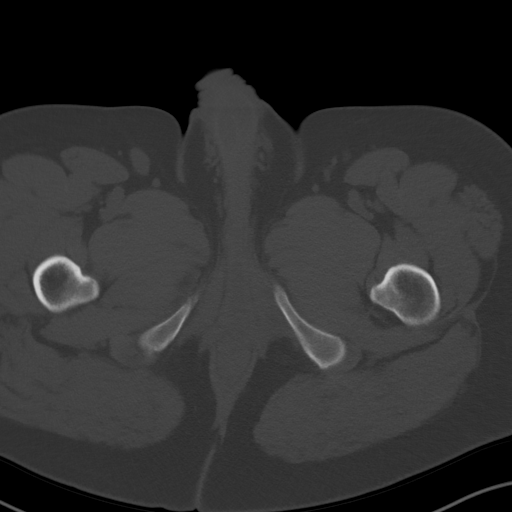
[im 11/85  soft-tissue]
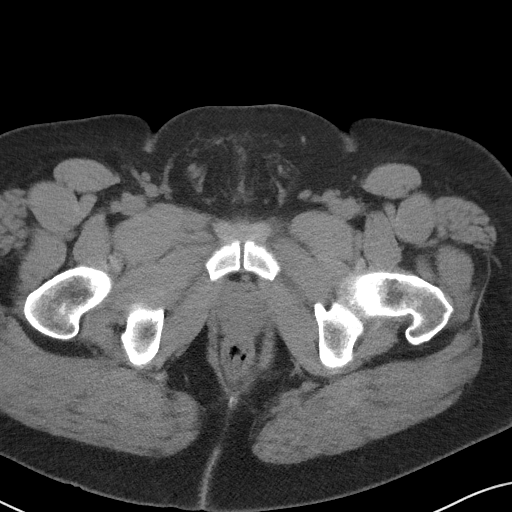
[im 17/85  soft-tissue]
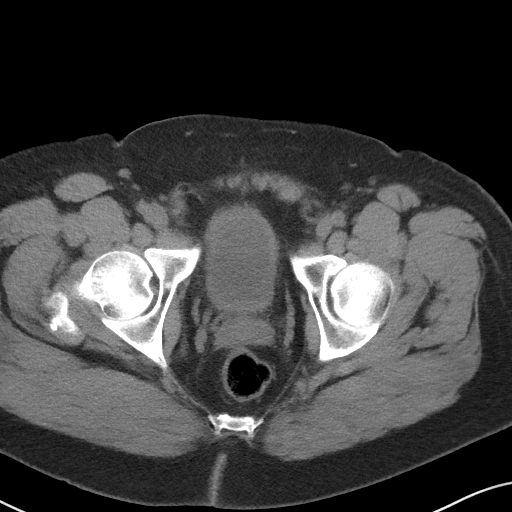
[im 24/85  soft-tissue]
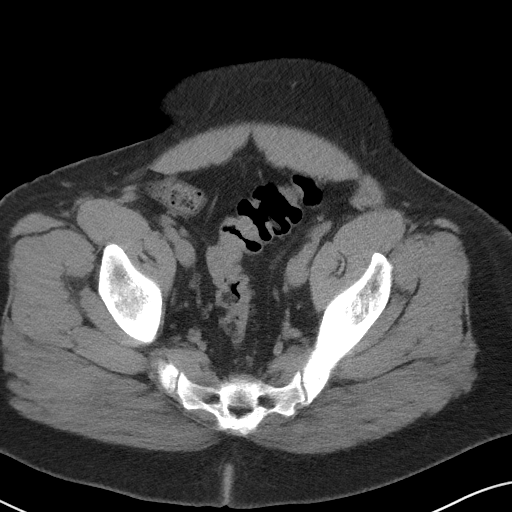
[im 27/85  soft-tissue]
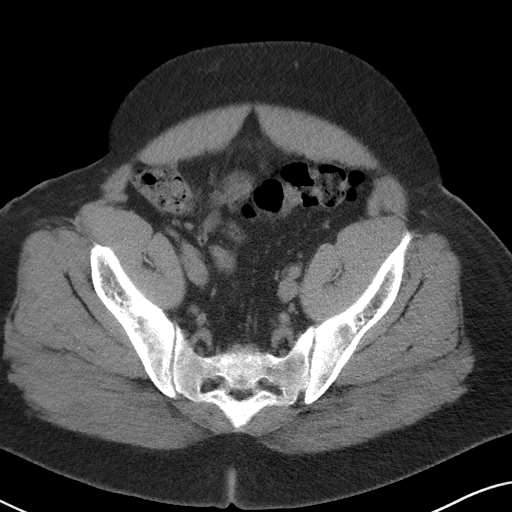
[im 34/85  soft-tissue]
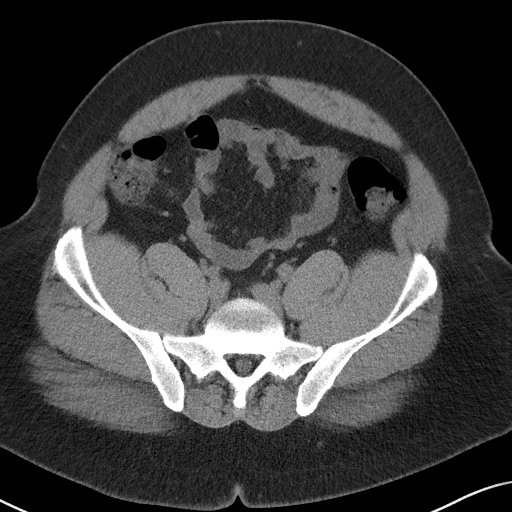
[im 41/85  soft-tissue]
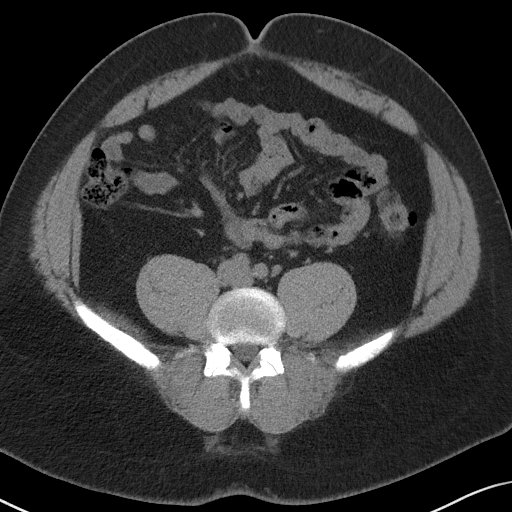
[im 44/85  soft-tissue]
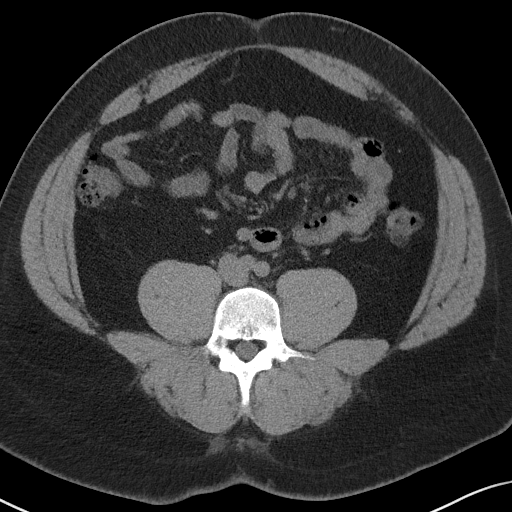
[im 51/85  soft-tissue]
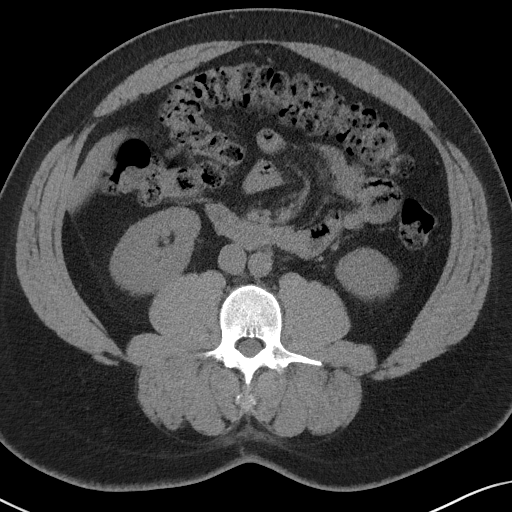
[im 51/85  bone]
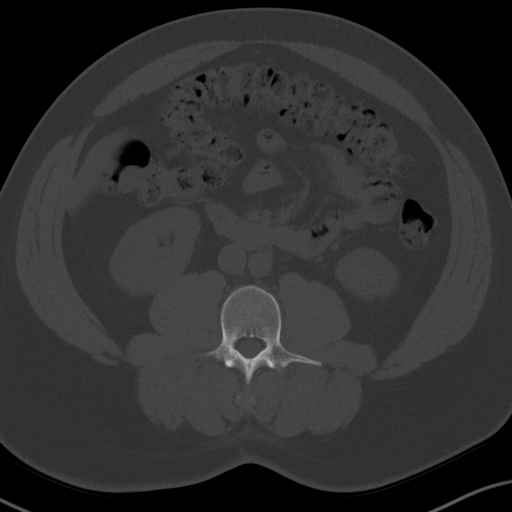
[im 58/85  soft-tissue]
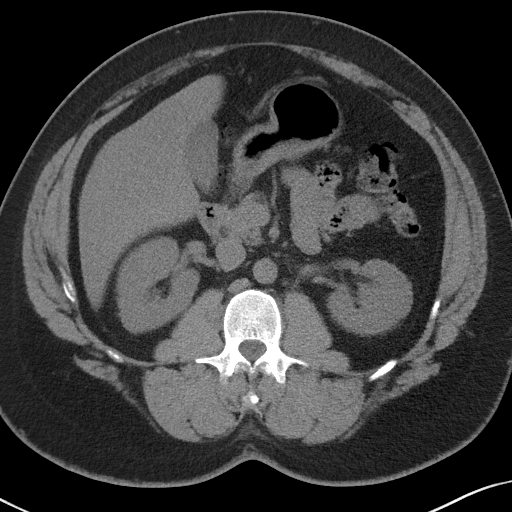
[im 64/85  soft-tissue]
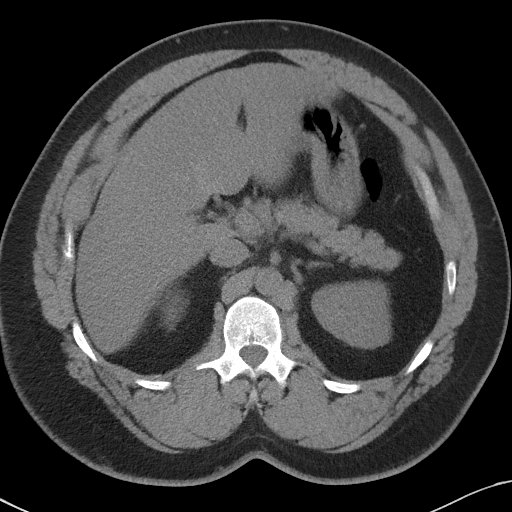
[im 68/85  soft-tissue]
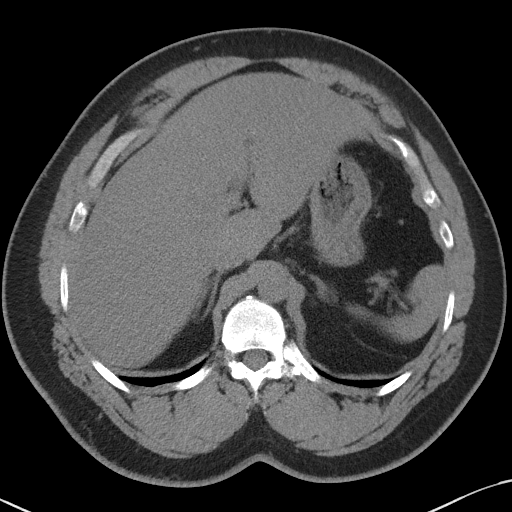
[im 74/85  soft-tissue]
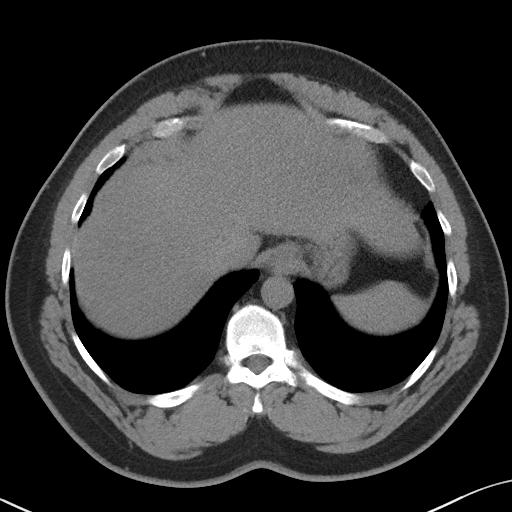
[im 81/85  soft-tissue]
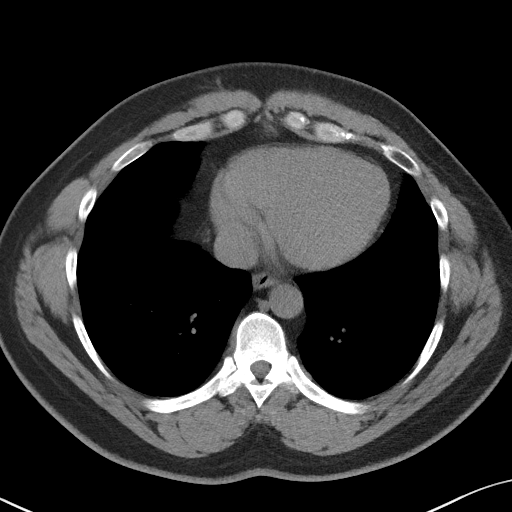

[Series 5: coronal · coronal · 0.87mm/px · 3 of 180 slices shown]
[im 60/180  soft-tissue]
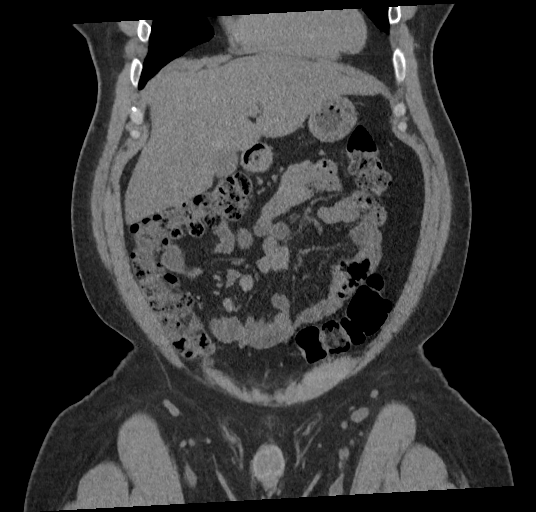
[im 80/180  soft-tissue]
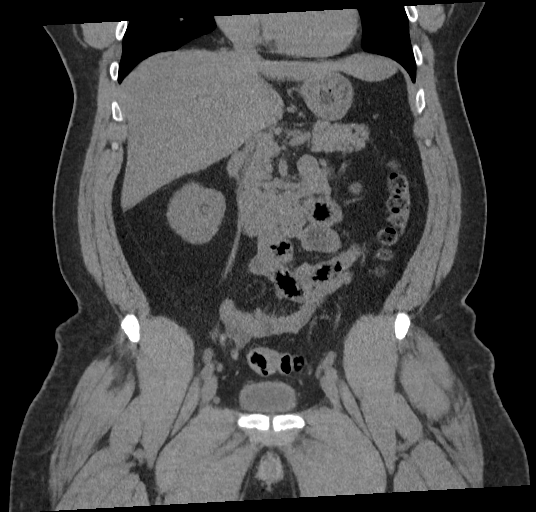
[im 100/180  soft-tissue]
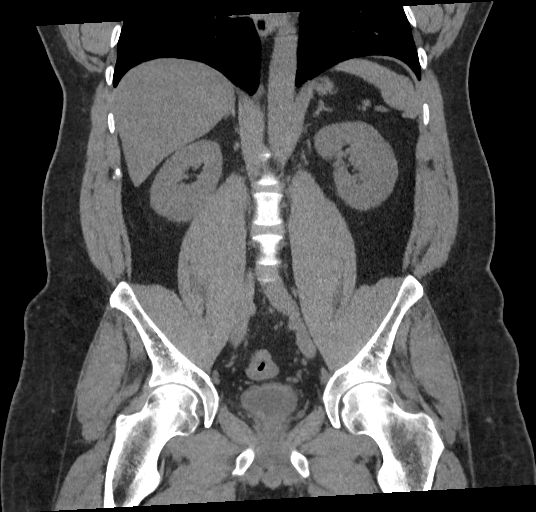

[17 of 46 positions shown; findings below may reference images not displayed]

FINDINGS: Lower chest: No acute abnormality.

Hepatobiliary: No focal liver abnormality. The gallbladder appears
normal. No biliary dilatation.

Pancreas: Unremarkable. No pancreatic ductal dilatation or
surrounding inflammatory changes.

Spleen: Normal in size without focal abnormality.

Adrenals/Urinary Tract: Adrenal glands are unremarkable. Kidneys are
normal, without renal calculi, focal lesion, or hydronephrosis.
Bladder is unremarkable.

Stomach/Bowel: Stomach is within normal limits. Appendix appears
normal. No evidence of bowel wall thickening, distention, or
inflammatory changes.

Vascular/Lymphatic: No significant vascular findings are present. No
enlarged abdominal or pelvic lymph nodes.

Reproductive: Uterus and bilateral adnexa are unremarkable.

Other: No abdominal wall hernia or abnormality. No abdominopelvic
ascites.

Musculoskeletal: No acute or significant osseous findings.
IMPRESSION: 1. No acute findings.
2. No kidney stones or hydronephrosis.

## 2019-11-25 ENCOUNTER — Encounter: Payer: Self-pay | Admitting: Emergency Medicine

## 2019-11-25 ENCOUNTER — Other Ambulatory Visit: Payer: Self-pay

## 2019-11-25 DIAGNOSIS — R358 Other polyuria: Secondary | ICD-10-CM | POA: Diagnosis not present

## 2019-11-25 DIAGNOSIS — R631 Polydipsia: Secondary | ICD-10-CM | POA: Insufficient documentation

## 2019-11-25 DIAGNOSIS — Z7984 Long term (current) use of oral hypoglycemic drugs: Secondary | ICD-10-CM | POA: Diagnosis not present

## 2019-11-25 DIAGNOSIS — H538 Other visual disturbances: Secondary | ICD-10-CM | POA: Diagnosis present

## 2019-11-25 DIAGNOSIS — R42 Dizziness and giddiness: Secondary | ICD-10-CM | POA: Diagnosis not present

## 2019-11-25 DIAGNOSIS — F17210 Nicotine dependence, cigarettes, uncomplicated: Secondary | ICD-10-CM | POA: Diagnosis not present

## 2019-11-25 DIAGNOSIS — E1165 Type 2 diabetes mellitus with hyperglycemia: Secondary | ICD-10-CM | POA: Insufficient documentation

## 2019-11-25 LAB — CBC
HCT: 44 % (ref 39.0–52.0)
Hemoglobin: 15.2 g/dL (ref 13.0–17.0)
MCH: 30.1 pg (ref 26.0–34.0)
MCHC: 34.5 g/dL (ref 30.0–36.0)
MCV: 87.1 fL (ref 80.0–100.0)
Platelets: 260 10*3/uL (ref 150–400)
RBC: 5.05 MIL/uL (ref 4.22–5.81)
RDW: 12.4 % (ref 11.5–15.5)
WBC: 7.6 10*3/uL (ref 4.0–10.5)
nRBC: 0 % (ref 0.0–0.2)

## 2019-11-25 LAB — GLUCOSE, CAPILLARY: Glucose-Capillary: >600 mg/dL (ref 70–99)

## 2019-11-25 LAB — BASIC METABOLIC PANEL
Anion gap: 11 (ref 5–15)
BUN: 12 mg/dL (ref 6–20)
CO2: 27 mmol/L (ref 22–32)
Calcium: 9.7 mg/dL (ref 8.9–10.3)
Chloride: 91 mmol/L — ABNORMAL LOW (ref 98–111)
Creatinine, Ser: 1.22 mg/dL (ref 0.61–1.24)
GFR calc Af Amer: >60 mL/min (ref >60)
GFR calc non Af Amer: >60 mL/min (ref >60)
Glucose, Bld: 730 mg/dL (ref 70–99)
Potassium: 4.3 mmol/L (ref 3.5–5.1)
Sodium: 129 mmol/L — ABNORMAL LOW (ref 135–145)

## 2019-11-25 MED ORDER — SODIUM CHLORIDE 0.9 % IV SOLN
Freq: Once | INTRAVENOUS | Status: AC
  Administered 2019-11-25: 23:00:00 1000 mL via INTRAVENOUS

## 2019-11-25 MED ORDER — SODIUM CHLORIDE 0.9 % IV BOLUS: 1000.0000 mL | Freq: Once | INTRAVENOUS | Status: DC

## 2019-11-25 NOTE — ED Triage Notes (Signed)
Pt reports for the past week, has been having frequent urination, thirst, blurry vision. Pt reports feeling tired all the time. Pt reports no history of DM. Pt denies any other symptom at present

## 2019-11-26 ENCOUNTER — Emergency Department
Admission: EM | Admit: 2019-11-26 | Discharge: 2019-11-26 | Disposition: A | Discharge status: Home / Self Care | Payer: 59 | Attending: Student | Admitting: Student

## 2019-11-26 DIAGNOSIS — R358 Other polyuria: Secondary | ICD-10-CM | POA: Diagnosis not present

## 2019-11-26 DIAGNOSIS — F17210 Nicotine dependence, cigarettes, uncomplicated: Secondary | ICD-10-CM | POA: Diagnosis not present

## 2019-11-26 DIAGNOSIS — E119 Type 2 diabetes mellitus without complications: Secondary | ICD-10-CM

## 2019-11-26 DIAGNOSIS — R739 Hyperglycemia, unspecified: Secondary | ICD-10-CM

## 2019-11-26 DIAGNOSIS — R631 Polydipsia: Secondary | ICD-10-CM | POA: Diagnosis not present

## 2019-11-26 DIAGNOSIS — Z7984 Long term (current) use of oral hypoglycemic drugs: Secondary | ICD-10-CM | POA: Diagnosis not present

## 2019-11-26 DIAGNOSIS — R42 Dizziness and giddiness: Secondary | ICD-10-CM | POA: Diagnosis not present

## 2019-11-26 DIAGNOSIS — E1165 Type 2 diabetes mellitus with hyperglycemia: Secondary | ICD-10-CM | POA: Diagnosis not present

## 2019-11-26 LAB — URINALYSIS, COMPLETE (UACMP) WITH MICROSCOPIC
Bacteria, UA: NONE SEEN
Bilirubin Urine: NEGATIVE
Glucose, UA: >=500 mg/dL — AB
Hgb urine dipstick: NEGATIVE
Ketones, ur: NEGATIVE mg/dL
Leukocytes,Ua: NEGATIVE
Nitrite: NEGATIVE
Protein, ur: NEGATIVE mg/dL
Specific Gravity, Urine: 1.03 (ref 1.005–1.030)
Squamous Epithelial / LPF: NONE SEEN (ref 0–5)
pH: 6 (ref 5.0–8.0)

## 2019-11-26 LAB — GLUCOSE, CAPILLARY
Glucose-Capillary: 441 mg/dL — ABNORMAL HIGH (ref 70–99)
Glucose-Capillary: 321 mg/dL — ABNORMAL HIGH (ref 70–99)
Glucose-Capillary: 309 mg/dL — ABNORMAL HIGH (ref 70–99)

## 2019-11-26 MED ORDER — METFORMIN HCL 500 MG PO TABS: 500.0000 mg | ORAL_TABLET | Freq: Two times a day (BID) | ORAL | 0 refills | Status: DC

## 2019-11-26 MED ORDER — LACTATED RINGERS IV BOLUS
1000.0000 mL | Freq: Once | INTRAVENOUS | Status: AC
  Administered 2019-11-26: 1000 mL via INTRAVENOUS

## 2019-11-26 MED ORDER — INSULIN ASPART 100 UNIT/ML ~~LOC~~ SOLN: 3.0000 [IU] | Freq: Once | SUBCUTANEOUS | Status: DC

## 2019-11-26 MED ORDER — INSULIN ASPART 100 UNIT/ML ~~LOC~~ SOLN
4.0000 [IU] | Freq: Once | SUBCUTANEOUS | Status: AC
  Administered 2019-11-26: 02:00:00 4 [IU] via SUBCUTANEOUS
  Filled 2019-11-26: qty 1

## 2019-11-26 MED ORDER — LACTATED RINGERS IV BOLUS
1000.0000 mL | Freq: Once | INTRAVENOUS | Status: AC
  Administered 2019-11-26: 02:00:00 1000 mL via INTRAVENOUS

## 2019-11-26 NOTE — ED Provider Notes (Signed)
[facility] Emergency Department Provider Note  ____________________________________________   First MD Initiated Contact with Patient 11/26/19 0118     (approximate)  I have reviewed the triage vital signs and the nursing notes.  History  Chief Complaint Blurred Vision and Weakness    HPI Rich Paprocki is a 44 y.o. male with a history of asthma who presents to the emergency department for increased thirst, frequent urination, lightheadedness, blurry vision, fatigue.  Symptoms have been present for approximately 1 week and have progressively worsening.  Reports occasional vomiting.  Denies any history of similar symptoms.  No personal history of diabetes, but does have family history.  Has been trying to drink a lot of Kool-Aid and orange juice because of the thirst.   Past Medical Hx Past Medical History:  Diagnosis Date  . Asthma     Problem List There are no problems to display for this patient.   Past Surgical Hx History reviewed. No pertinent surgical history.  Medications Prior to Admission medications   Medication Sig Start Date End Date Taking? Authorizing Provider  albuterol (ACCUNEB) 1.25 MG/3ML nebulizer solution Take 1 ampule by nebulization every 6 (six) hours as needed for wheezing.    [provider]  albuterol (PROVENTIL HFA;VENTOLIN HFA) 108 (90 Base) MCG/ACT inhaler Inhale 2 puffs into the lungs every 6 (six) hours as needed for wheezing or shortness of breath.    [provider]  chlorpheniramine-HYDROcodone (TUSSIONEX PENNKINETIC ER) 10-8 MG/5ML SUER Take 5 mLs by mouth at bedtime as needed for cough. 10/23/17   Sable Feil, PA-C  HYDROcodone-acetaminophen (NORCO) 5-325 MG tablet Take 1 tablet by mouth every 6 (six) hours as needed for moderate pain. 11/11/15   Duanne Guess, PA-C  methylPREDNISolone (MEDROL DOSEPAK) 4 MG TBPK tablet Take Tapered dose as directed 12/24/15   Sable Feil, PA-C    methylPREDNISolone (MEDROL DOSEPAK) 4 MG TBPK tablet Take Tapered dose as directed 10/23/17   Sable Feil, PA-C    Allergies Other  Family Hx No family history on file.  Social Hx Social History   Tobacco Use  . Smoking status: Current Every Day Smoker    Packs/day: 2.00    Types: Cigarettes  . Smokeless tobacco: Never Used  Substance Use Topics  . Alcohol use: Yes    Comment: Occ  . Drug use: No     Review of Systems  Constitutional: Negative for fever. Negative for chills.  Positive for fatigue. Eyes: Positive for blurred vision. ENT: Negative for sore throat. Cardiovascular: Negative for chest pain. Respiratory: Negative for shortness of breath. Gastrointestinal: Negative for nausea. Negative for vomiting.  Genitourinary: Negative for dysuria. Musculoskeletal: Negative for leg swelling. Endocrine: Positive for increased polyuria, polydipsia Skin: Negative for rash. Neurological: Negative for headaches.   Physical Exam  Vital Signs: ED Triage Vitals  Enc Vitals Group     BP 11/25/19 2258 (!) 141/95     Pulse Rate 11/25/19 2258 (!) 105     Resp 11/25/19 2258 20     Temp 11/25/19 2258 98.5 F (36.9 C)     Temp Source 11/25/19 2258 Oral     SpO2 11/25/19 2258 95 %     Weight 11/25/19 2259 230 lb (104.3 kg)     Height 11/25/19 2259 5\' 6"  (1.676 m)     Head Circumference --      Peak Flow --      Pain Score 11/25/19 2259 0     Pain  Loc --      Pain Edu? --      Excl. in GC? --     Constitutional: Alert and oriented.  Overweight.  NAD.  Head: Normocephalic. Atraumatic. Eyes: Conjunctivae clear. Sclera anicteric. Pupils equal and symmetric. Nose: No masses or lesions. No congestion or rhinorrhea. Mouth/Throat: Wearing mask.  Neck: No stridor. Trachea midline.  Cardiovascular: Normal rate, regular rhythm. Extremities well perfused. Respiratory: Normal respiratory effort.  Lungs CTAB. Gastrointestinal: Soft. Non-distended. Non-tender.   Genitourinary: Deferred. Musculoskeletal: No lower extremity edema. No deformities. Neurologic:  Normal speech and language. No gross focal or lateralizing neurologic deficits are appreciated.  Skin: Skin is warm, dry and intact. No rash noted. Psychiatric: Mood and affect are appropriate for situation.  EKG  N/A    Radiology  N/A  Procedures  Procedure(s) performed (including critical care):  Procedures   Initial Impression / Assessment and Plan / MDM / ED Course  44 y.o. male who presents to the ED for 1 week of progressive polyuria, polydipsia, fatigue, lightheadedness, blurred vision.  Presentation seems most consistent with new onset diabetes, hyperglycemia.  Other differential includes dehydration, other electrolyte abnormality, UTI  Will plan for labs, fluids, reassess.    Work-up initiated in triage reveals hyperglycemia, glucose 730.  Sodium 129, corrects to normal in the setting of hyperglycemia.  Normal anion gap and no ketones in urine to suggest DKA.  No altered mental status to suggest HHS.   After fluids and insulin, patient's glucose has decreased to 309.  He has tolerated PO. As such, feel patient is stable for discharge with outpatient follow-up.  Given information for new diagnosis of diabetes and diabetic diet, he has a PCP appointment scheduled for Monday.  Provided Rx for Metformin and discussed potential side effects.  Patient voices understanding is comfortable to plan and discharge.  Given return precautions.   _______________________________   As part of my medical decision making I have reviewed available labs, radiology tests, reviewed old records/chart review.    Final Clinical Impression(s) / ED Diagnosis  Final diagnoses:  Hyperglycemia  Diabetes mellitus, new onset (HCC)       Note:  This document was prepared using Dragon voice recognition software and may include unintentional dictation errors.   Miguel Aschoff., MD 11/26/19  [phone]

## 2019-11-26 NOTE — ED Notes (Signed)
Pt up to restroom at this time without difficulty.

## 2019-11-26 NOTE — ED Notes (Signed)
Pt up to restroom without difficulty.  

## 2019-11-26 NOTE — Discharge Instructions (Addendum)
Thank you for letting us take care of you in the emergency department today.   Please continue to take any regular, prescribed medications.   New medications we have prescribed:  Metformin - to help with your sugar (glucose)  Please follow up with: Your primary care doctor to review your ER visit and follow up on your symptoms.    Please return to the ER for any new or worsening symptoms.

## 2019-11-26 NOTE — ED Notes (Signed)
E-signature pad malfunction. Pt signed a hard copy of his d/c consent and it was placed in the tray for medical records pick up

## 2020-01-04 NOTE — Progress Notes (Addendum)
New patient visit   Patient: Austin Serrano   DOB: 1975/08/28   44 y.o. Male  MRN: [id number] Visit Date: 01/05/2020  Today's healthcare provider: Marcille Buffy, FNP   Chief Complaint  Patient presents with  . New Patient (Initial Visit)   Subjective    Austin Serrano is a 44 y.o. male who presents today as a new patient to establish care.  HPI  Patient comes to our office today to establish care, patient reports that he feels fairly well today but would like to address his new diagnosis of diabetes. Patient reports that he was seen at [facility] ED on 11/26/19 with complaints of loss of vision and increased urination. Patient reports that Glucose reading in hospital was > 600 and he was started on Metformin 577m BID. Patient states while on Metformin he noticed his blood sugar readings would roughly range from 179-300.   On 12/05/19 patient reports that he decided to stop Metformin and work on lifestyle changes. Patient reports that he is now following a keto diet and taking nature walks on the weekend.   He is still having photophobia, denies any discharge or pain in eyes. Denies any trauma or chemosis/ injury. Has not had eye exam in years. Denies being able to see or significant visual changes but does feel as if his eye are blurry at times.   Patients blood sugar readings since discontinuing Metformin have been ranging from 93-339. Patient reports family history on paternal side with diabetes. Patient reports today that his vision is back to normal but reports that he has been having sensitivity to light, patient also reports frequent migraine headaches that he describes as pounding on the left side of his head. Patient states since being diagnosed with diabetes he has noticed changes to his feet and reports today that he has blisters on both feet. Patient denies numbness or tingling in his extremities.    He reports he has not taken Metformin since 12/04/19 he is doing  exercise and healthy lifestyle changes he reports.   He has blisters on both feet he wants examined today, he has been popping them and cleaning them. He reports they itch and are only on soles of feet, he has been popping them with a needle and cleaning with alcohol. Present x 3 weeks. These " blisters " are intensely itchy he reports and painful.    Asthma not controlled, has Albuterol no maintenance inhaler.  Asthma symptoms of wheezing when outside have worsened. He also has increased rhinorrhea.    Patient  denies any fever, body aches,chills, rash, chest pain, shortness of breath, nausea, vomiting, or diarrhea.  Denies dizziness, lightheadedness, pre syncopal or syncopal episodes.    Past Medical History:  Diagnosis Date  . Asthma   . Diabetes mellitus without complication ([place]    History reviewed. No pertinent surgical history. Family Status  Relation Name Status  . Mother  (Not Specified)   Family History  Problem Relation Age of Onset  . Breast cancer Mother   . Hypertension Mother    Social History   Socioeconomic History  . Marital status: Married    Spouse name: Not on file  . Number of children: Not on file  . Years of education: Not on file  . Highest education level: Not on file  Occupational History  . Not on file  Tobacco Use  . Smoking status: Current Every Day Smoker    Packs/day: 2.00    Types: Cigarettes  .  Smokeless tobacco: Never Used  Substance and Sexual Activity  . Alcohol use: Yes    Comment: Occ  . Drug use: No  . Sexual activity: Yes  Other Topics Concern  . Not on file  Social History Narrative  . Not on file   Social Determinants of Health   Financial Resource Strain:   . Difficulty of Paying Living Expenses:   Food Insecurity:   . Worried About Charity fundraiser in the Last Year:   . Arboriculturist in the Last Year:   Transportation Needs:   . Film/video editor (Medical):   Marland Kitchen Lack of Transportation (Non-Medical):    Physical Activity:   . Days of Exercise per Week:   . Minutes of Exercise per Session:   Stress:   . Feeling of Stress :   Social Connections:   . Frequency of Communication with Friends and Family:   . Frequency of Social Gatherings with Friends and Family:   . Attends Religious Services:   . Active Member of Clubs or Organizations:   . Attends Archivist Meetings:   Marland Kitchen Marital Status:    Outpatient Medications Prior to Visit  Medication Sig  . albuterol (PROVENTIL HFA;VENTOLIN HFA) 108 (90 Base) MCG/ACT inhaler Inhale 2 puffs into the lungs every 6 (six) hours as needed for wheezing or shortness of breath.  . chlorpheniramine-HYDROcodone (TUSSIONEX PENNKINETIC ER) 10-8 MG/5ML SUER Take 5 mLs by mouth at bedtime as needed for cough.  . metFORMIN (GLUCOPHAGE) 500 MG tablet Take 1 tablet (500 mg total) by mouth 2 (two) times daily with a meal.  . methylPREDNISolone (MEDROL DOSEPAK) 4 MG TBPK tablet Take Tapered dose as directed  . methylPREDNISolone (MEDROL DOSEPAK) 4 MG TBPK tablet Take Tapered dose as directed  . [DISCONTINUED] albuterol (ACCUNEB) 1.25 MG/3ML nebulizer solution Take 1 ampule by nebulization every 6 (six) hours as needed for wheezing.  . [DISCONTINUED] clindamycin (CLEOCIN) 300 MG capsule Take by mouth.  . [DISCONTINUED] HYDROcodone-acetaminophen (NORCO) 5-325 MG tablet Take 1 tablet by mouth every 6 (six) hours as needed for moderate pain.  . [DISCONTINUED] HYDROcodone-acetaminophen (NORCO/VICODIN) 5-325 MG tablet Take by mouth.   No facility-administered medications prior to visit.   Allergies  Allergen Reactions  . Other     Seasonal allergies Cats     There is no immunization history on file for this patient.  Health Maintenance  Topic Date Due  . HEMOGLOBIN A1C  Never done  . PNEUMOCOCCAL POLYSACCHARIDE VACCINE AGE 20-64 HIGH RISK  Never done  . FOOT EXAM  Never done  . OPHTHALMOLOGY EXAM  Never done  . URINE MICROALBUMIN  Never done  .  COVID-19 Vaccine (1) Never done  . HIV Screening  Never done  . TETANUS/TDAP  Never done  . INFLUENZA VACCINE  03/11/2020    Patient Care Team: Doreen Beam, FNP as PCP - General (Family Medicine)  Review of Systems  Constitutional: Positive for fatigue. Negative for activity change, appetite change, chills, diaphoresis, fever and unexpected weight change.  HENT: Positive for rhinorrhea. Negative for congestion.   Eyes: Positive for photophobia and visual disturbance. Negative for pain, discharge, redness and itching.  Respiratory: Positive for wheezing. Negative for apnea, cough, choking, chest tightness, shortness of breath and stridor.   Cardiovascular: Negative for chest pain, palpitations and leg swelling.  Gastrointestinal: Negative.   Genitourinary: Negative.   Musculoskeletal: Negative.   Skin: Positive for rash.  Neurological: Negative.   Psychiatric/Behavioral: Negative.  Last CBC Lab Results  Component Value Date   WBC 7.6 11/25/2019   HGB 9.9 (A) 01/05/2020   HCT 44.0 11/25/2019   MCV 87.1 11/25/2019   MCH 30.1 11/25/2019   RDW 12.4 11/25/2019   PLT 260 64/40/3474   Last metabolic panel Lab Results  Component Value Date   GLUCOSE 730 (HH) 11/25/2019   NA 129 (L) 11/25/2019   K 4.3 11/25/2019   CL 91 (L) 11/25/2019   CO2 27 11/25/2019   BUN 12 11/25/2019   CREATININE 1.22 11/25/2019   GFRNONAA >60 11/25/2019   GFRAA >60 11/25/2019   CALCIUM 9.7 11/25/2019   PROT 7.3 08/15/2018   ALBUMIN 4.0 08/15/2018   BILITOT 0.5 08/15/2018   ALKPHOS 51 08/15/2018   AST 24 08/15/2018   ALT 23 08/15/2018   ANIONGAP 11 11/25/2019   Last lipids No results found for: CHOL, HDL, LDLCALC, LDLDIRECT, TRIG, CHOLHDL Last hemoglobin A1c No results found for: HGBA1C Last thyroid functions No results found for: TSH, T3TOTAL, T4TOTAL, THYROIDAB Last vitamin D No results found for: 25OHVITD2, 25OHVITD3, VD25OH Last vitamin B12 and Folate No results found for:  VITAMINB12, FOLATE    Objective    Blood pressure 120/70, pulse 86, temperature (!) 96.9 F (36.1 C), temperature source Oral, resp. rate 16, height '5\' 6"'  (1.676 m), weight 246 lb 6.4 oz (111.8 kg), SpO2 98 %.   Physical Exam Vitals and nursing note reviewed.  Constitutional:      General: He is not in acute distress.    Appearance: Normal appearance. He is well-developed. He is obese. He is not ill-appearing, toxic-appearing or diaphoretic.     Comments: Patient is alert and oriented and responsive to questions Engages in eye contact with provider. Speaks in full sentences without any pauses without any shortness of breath or distress.    HENT:     Head: Normocephalic and atraumatic.     Right Ear: Hearing, tympanic membrane, ear canal and external ear normal.     Left Ear: Hearing, tympanic membrane, ear canal and external ear normal.     Nose: Nose normal.     Mouth/Throat:     Pharynx: Uvula midline. No oropharyngeal exudate.  Eyes:     General: Lids are normal. Lids are everted, no foreign bodies appreciated. Vision grossly intact. Gaze aligned appropriately. No allergic shiner, visual field deficit or scleral icterus.       Right eye: No foreign body or discharge.        Left eye: No foreign body or discharge.     Extraocular Movements: Extraocular movements intact.     Conjunctiva/sclera: Conjunctivae normal.     Pupils: Pupils are equal, round, and reactive to light.     Comments: Able to read all signs in room without difficulty.    Neck:     Thyroid: No thyromegaly.     Vascular: Normal carotid pulses. No carotid bruit, hepatojugular reflux or JVD.     Trachea: Trachea and phonation normal. No tracheal tenderness or tracheal deviation.     Meningeal: Brudzinski's sign absent.  Cardiovascular:     Rate and Rhythm: Normal rate and regular rhythm.     Pulses: Normal pulses.          Dorsalis pedis pulses are 2+ on the right side and 2+ on the left side.       Posterior  tibial pulses are 2+ on the right side and 2+ on the left side.     Heart  sounds: Normal heart sounds, S1 normal and S2 normal. Heart sounds not distant. No murmur. No friction rub. No gallop.   Pulmonary:     Effort: Pulmonary effort is normal. No accessory muscle usage or respiratory distress.     Breath sounds: Normal air entry. No stridor. Examination of the right-upper field reveals wheezing. Examination of the left-upper field reveals wheezing. Wheezing present. No rhonchi or rales.     Comments: Mild expiratory wheezing.  Chest:     Chest wall: No tenderness.  Abdominal:     General: Bowel sounds are normal. There is no distension.     Palpations: Abdomen is soft. There is no mass.     Tenderness: There is no abdominal tenderness. There is no guarding or rebound.     Hernia: No hernia is present.  Musculoskeletal:        General: No tenderness or deformity. Normal range of motion.     Cervical back: Full passive range of motion without pain, normal range of motion and neck supple.     Comments: Patient moves on and off of exam table and in room without difficulty. Gait is normal in hall and in room. Patient is oriented to person place time and situation. Patient answers questions appropriately and engages in conversation.   Feet:     Right foot:     Protective Sensation: 9 sites tested. 9 sites sensed.     Skin integrity: Erythema present.     Left foot:     Protective Sensation: 9 sites tested. 9 sites sensed.     Skin integrity: Erythema present.     Comments: Ill-defined erythema and hyperpigmentation and scale in a moccasin distribution with scattered bullous type lesions on arch of bilateral feet.   One of these areas appears infected with surrounding erythema.   No broken skin.   Pedal pulse 2+ bilaterally DP and PT.      Lymphadenopathy:     Head:     Right side of head: No submental, submandibular, tonsillar, preauricular, posterior auricular or occipital  adenopathy.     Left side of head: No submental, submandibular, tonsillar, preauricular, posterior auricular or occipital adenopathy.     Cervical: No cervical adenopathy.  Skin:    General: Skin is warm and dry.     Capillary Refill: Capillary refill takes less than 2 seconds.     Coloration: Skin is not pale.     Findings: Rash present. No erythema.     Nails: There is no clubbing.  Neurological:     Mental Status: He is alert and oriented to person, place, and time.     GCS: GCS eye subscore is 4. GCS verbal subscore is 5. GCS motor subscore is 6.     Cranial Nerves: No cranial nerve deficit.     Sensory: No sensory deficit.     Motor: No abnormal muscle tone.     Coordination: Coordination normal.     Gait: Gait normal.     Deep Tendon Reflexes: Reflexes are normal and symmetric. Reflexes normal.  Psychiatric:        Speech: Speech normal.        Behavior: Behavior normal.        Thought Content: Thought content normal.        Judgment: Judgment normal.     Results for orders placed or performed in visit on 01/05/20 (from the past 24 hour(s))  POCT hemoglobin     Status: Abnormal  Collection Time: 01/05/20 10:25 AM  Result Value Ref Range   Hemoglobin 9.9 (A) 11 - 14.6 g/dL    Depression Screen PHQ 2/9 Scores 01/05/2020  PHQ - 2 Score 0  PHQ- 9 Score 2   Results for orders placed or performed in visit on 01/05/20  POCT hemoglobin  Result Value Ref Range   Hemoglobin 9.9 (A) 11 - 14.6 g/dL    Assessment & Plan      Hyperlipidemia associated with type 2 diabetes mellitus ([place]) - Plan: Lipid panel  Photophobia of both eyes - Plan: TSH, Ambulatory referral to Ophthalmology  Vitamin D insufficiency - Plan: VITAMIN D 25 Hydroxy (Vit-D Deficiency, Fractures)  Wheezing - Plan: TSH, POCT hemoglobin, Microalbumin, urine, C-peptide, CANCELED: POCT urinalysis dipstick  Cyst of skin- bilateral feet  - Plan: CBC with Differential/Platelet, Comprehensive metabolic panel,  Ambulatory referral to Dermatology  Screening for blood or protein in urine - Plan: CANCELED: POCT urinalysis dipstick  Intermittent asthma, unspecified asthma severity, unspecified whether complicated  Tinea pedis of both feet  Localized bacterial skin infection- feet bilateral.   Hyperpigmentation of skin- bilateral feet  Body mass index (BMI) of 39.0-39.9 in adult  Tobacco use disorder, continuous  Smoking cessation instruction/counseling given:  counseled patient on the dangers of tobacco use, advised patient to stop smoking, and reviewed strategies to maximize success  Will refer to dermatology for cysts/ bullae on plantar arch services of feet with hypopigmentation will give Ketonazole wash to use as directed after completing antibiotics for mild skin infection likely from needle used to lance the area.  Return to office or be seen immediately if any symptoms changing or worsening at anytime.    He is advised to restart the Metformin as directed and keep log of blood sugars. Fasting in the am sugar should be reordered as well and check anytime signs of hypoglycemia or hyperglycemia. Education on diabetes given. Diet. Referral to nutritionist is an option.  Weight loss and dietary changes advised.  Return for fasting labs.    Referral to [facility] for evaluation of ongoing photophobia without any other neurological manifestations reported.   Filed Weights   01/05/20 0959  Weight: 246 lb 6.4 oz (111.8 kg)  Body mass index is 39.77 kg/m.    Meds ordered this encounter  Medications  . blood glucose meter kit and supplies    Sig: Dispense based on patient and insurance preference. Use up to four times daily as directed. (FOR ICD-10 E10.9, E11.9).    Dispense:  1 each    Refill:  0    Check blood sugar three times daily.    Order Specific Question:   Number of strips    Answer:   180    Order Specific Question:   Number of lancets    Answer:   180  . clindamycin  (CLEOCIN) 300 MG capsule    Sig: Take 1 capsule (300 mg total) by mouth 3 (three) times daily.    Dispense:  30 capsule    Refill:  0  . ketoconazole (NIZORAL) 2 % shampoo    Sig: Apply 1 application topically 2 (two) times a week. Lather and wash both feet.  Leave on for around 10 minutes. For foot fungus.Do not use on any open sores.    Dispense:  120 mL    Refill:  0  . fluticasone-salmeterol (ADVAIR HFA) 115-21 MCG/ACT inhaler    Sig: Inhale 2 puffs into the lungs 2 (two) times daily. RINSE MOUTH  AFTER USE. USE DAILY.    Dispense:  1 Inhaler    Refill:  3   Needs CPE and follow up on diabetes/ asthma / feet. Needs urine POCT test at next visit was unable to give urine at today's visit for screening of blood and protein. Ordered urine micro albumin at lab due to office currently out of strips.   Return in about 3 months (around 04/06/2020), or if symptoms worsen or fail to improve, for at any time for any worsening symptoms, Go to Emergency room/ urgent care if worse.     IWellington Hampshire Flinchum, FNP, have reviewed all documentation for this visit. The documentation on 01/05/20 for the exam, diagnosis, procedures, and orders are all accurate and complete.  Addressed extensive list of chronic and acute medical problems today requiring *38 minutes reviewing his medical record, counseling patient regarding his conditions and coordination of care.    Marcille Buffy, [place] [phone] (phone) [phone] (fax)  [place]

## 2020-01-05 ENCOUNTER — Other Ambulatory Visit: Payer: Self-pay

## 2020-01-05 ENCOUNTER — Encounter: Payer: Self-pay | Admitting: Adult Health

## 2020-01-05 ENCOUNTER — Ambulatory Visit: Payer: 59 | Admitting: Adult Health

## 2020-01-05 VITALS — BP 120/70 | HR 86 | Temp 96.9°F | Resp 16 | Ht 66.0 in | Wt 246.4 lb

## 2020-01-05 DIAGNOSIS — Z6839 Body mass index (BMI) 39.0-39.9, adult: Secondary | ICD-10-CM

## 2020-01-05 DIAGNOSIS — R062 Wheezing: Secondary | ICD-10-CM

## 2020-01-05 DIAGNOSIS — L819 Disorder of pigmentation, unspecified: Secondary | ICD-10-CM

## 2020-01-05 DIAGNOSIS — E785 Hyperlipidemia, unspecified: Secondary | ICD-10-CM

## 2020-01-05 DIAGNOSIS — Z1389 Encounter for screening for other disorder: Secondary | ICD-10-CM | POA: Insufficient documentation

## 2020-01-05 DIAGNOSIS — B9689 Other specified bacterial agents as the cause of diseases classified elsewhere: Secondary | ICD-10-CM

## 2020-01-05 DIAGNOSIS — H53143 Visual discomfort, bilateral: Secondary | ICD-10-CM

## 2020-01-05 DIAGNOSIS — B353 Tinea pedis: Secondary | ICD-10-CM

## 2020-01-05 DIAGNOSIS — J4520 Mild intermittent asthma, uncomplicated: Secondary | ICD-10-CM

## 2020-01-05 DIAGNOSIS — L089 Local infection of the skin and subcutaneous tissue, unspecified: Secondary | ICD-10-CM | POA: Diagnosis not present

## 2020-01-05 DIAGNOSIS — E559 Vitamin D deficiency, unspecified: Secondary | ICD-10-CM | POA: Diagnosis not present

## 2020-01-05 DIAGNOSIS — E1169 Type 2 diabetes mellitus with other specified complication: Secondary | ICD-10-CM | POA: Diagnosis not present

## 2020-01-05 DIAGNOSIS — L729 Follicular cyst of the skin and subcutaneous tissue, unspecified: Secondary | ICD-10-CM | POA: Diagnosis not present

## 2020-01-05 DIAGNOSIS — J45909 Unspecified asthma, uncomplicated: Secondary | ICD-10-CM | POA: Insufficient documentation

## 2020-01-05 DIAGNOSIS — F17209 Nicotine dependence, unspecified, with unspecified nicotine-induced disorders: Secondary | ICD-10-CM

## 2020-01-05 LAB — POCT HEMOGLOBIN: Hemoglobin: 9.9 g/dL — AB (ref 11–14.6)

## 2020-01-05 MED ORDER — ADVAIR HFA 115-21 MCG/ACT IN AERO: 2.0000 | INHALATION_SPRAY | Freq: Two times a day (BID) | RESPIRATORY_TRACT | 3 refills | Status: DC

## 2020-01-05 MED ORDER — CLINDAMYCIN HCL 300 MG PO CAPS: 300.0000 mg | ORAL_CAPSULE | Freq: Three times a day (TID) | ORAL | 0 refills | Status: DC

## 2020-01-05 MED ORDER — KETOCONAZOLE 2 % EX SHAM: 1.0000 "application " | MEDICATED_SHAMPOO | CUTANEOUS | 0 refills | Status: DC

## 2020-01-05 MED ORDER — BLOOD GLUCOSE METER KIT: PACK | 0 refills | Status: AC

## 2020-01-05 NOTE — Patient Instructions (Addendum)
Schedule eye exam  Continue metformin return in 3 months for recheck.  Send me refill requests.   Fluticasone; Salmeterol inhalation powder What is this medicine? FLUTICASONE; SALMETEROL (floo TIK a sone; sal ME te role) inhalation is a combination of two medicines that decrease inflammation and help to open up the airways of your lungs. It is used to treat COPD. This medicine is also used to treat asthma. Do NOT use for an acute asthma attack. Do NOT use for a COPD attack. This medicine may be used for other purposes; ask your health care provider or pharmacist if you have questions. COMMON BRAND NAME(S): Advair, AirDuo Digihaler, Airduo RespiClick What should I tell my health care provider before I take this medicine? They need to know if you have any of these conditions:  bone problems  diabetes  eye disease, vision problems  immune system problems  heart disease or irregular heartbeat  high blood pressure  infection  pheochromocytoma  seizures  thyroid disease  worsening asthma  an unusual or allergic reaction to fluticasone; salmeterol, other corticosteroids, other medicines, foods, dyes, or preservatives  pregnant or trying to get pregnant  breast-feeding How should I use this medicine? This medicine is inhaled through the mouth. Rinse your mouth with water after use. Make sure not to swallow the water. Follow the directions on the prescription label. Do not use a spacer device with this inhaler. Take your medicine at regular intervals. Do not take your medicine more often than directed. Do not stop taking except on your doctor's advice. Make sure that you are using your inhaler correctly. Ask you doctor or health care provider if you have any questions. A special MedGuide will be given to you by the pharmacist with each prescription and refill. Be sure to read this information carefully each time. Talk to your pediatrician regarding the use of this medicine in  children. Special care may be needed. Overdosage: If you think you have taken too much of this medicine contact a poison control center or emergency room at once. NOTE: This medicine is only for you. Do not share this medicine with others. What if I miss a dose? If you miss a dose, use it as soon as you remember. If it is almost time for your next dose, use only that dose and continue with your regular schedule, spacing doses evenly. Do not use double or extra doses. What may interact with this medicine? Do not take this medicine with any of the following medications:  MAOIs like Carbex, Eldepryl, Marplan, Nardil, and Parnate This medicine may also interact with the following medications:  aminophylline or theophylline  antiviral medicines for HIV or AIDS  beta-blockers like metoprolol and propranolol  certain antibiotics like clarithromycin, erythromycin, levofloxacin, linezolid, and telithromycin  certain medicines for fungal infections like ketoconazole, itraconazole, posaconazole, voriconazole  conivaptan  diuretics  medicines for colds  medicines for depression or emotional conditions  nefazodone  vaccines This list may not describe all possible interactions. Give your health care provider a list of all the medicines, herbs, non-prescription drugs, or dietary supplements you use. Also tell them if you smoke, drink alcohol, or use illegal drugs. Some items may interact with your medicine. What should I watch for while using this medicine? Visit your doctor for regular check ups. Tell your doctor or health care professional if your symptoms do not get better. Do not use this medicine more than every 12 hours. NEVER use this medicine for an acute asthma attack.  You should use your short-acting rescue inhalers for this purpose. If your symptoms get worse or if you need your short-acting inhalers more often, call your doctor right away. If you are going to have surgery tell your  doctor or health care professional that you are using this medicine. Try not to come in contact with people with the chicken pox or measles. If you do, call your doctor. This medicine may increase blood sugar. Ask your healthcare provider if changes in diet or medicines are needed if you have diabetes. What side effects may I notice from receiving this medicine? Side effects that you should report to your doctor or health care professional as soon as possible:  allergic reactions like skin rash or hives, swelling of the face, lips, or tongue  breathing problems right after inhaling your medicine  chest pain  fast, irregular heartbeat  feeling faint or lightheaded, falls  fever or chills  nausea, vomiting  signs and symptoms of high blood sugar such as being more thirsty or hungry or having to urinate more than normal. You may also feel very tired or have blurry vision. Side effects that usually do not require medical attention (report to your doctor or health care professional if they continue or are bothersome):  cough  headache  nervousness  sore throat  stuffy nose  tremor This list may not describe all possible side effects. Call your doctor for medical advice about side effects. You may report side effects to FDA at 1-800-FDA-1088. Where should I keep my medicine? Keep out of the reach of children. Advair: Store at room temperature between 68 and 77 degrees F (20 and 25 degrees C). Do not leave your medicine in the heat or sun. Throw away 1 month after you open the package or whenever the dose indicator reads 0, whichever comes first. Throw away unopened packages after the expiration date. Airduo Respiclick: Store at room temperature between 59 and 86 degrees F (15 and 30 degrees C). Avoid exposure to extreme heat, cold, or humidity. Throw away 1 month after you open the package or whenever the dose indicator reads 0, whichever comes first. Throw away unopened packages after  the expiration date. NOTE: This sheet is a summary. It may not cover all possible information. If you have questions about this medicine, talk to your doctor, pharmacist, or health care provider.  2020 Elsevier/Gold Standard (2018-04-28 11:26:46)   Diabetes Mellitus and Nutrition, Adult When you have diabetes (diabetes mellitus), it is very important to have healthy eating habits because your blood sugar (glucose) levels are greatly affected by what you eat and drink. Eating healthy foods in the appropriate amounts, at about the same times every day, can help you:  Control your blood glucose.  Lower your risk of heart disease.  Improve your blood pressure.  Reach or maintain a healthy weight. Every person with diabetes is different, and each person has different needs for a meal plan. Your health care provider may recommend that you work with a diet and nutrition specialist (dietitian) to make a meal plan that is best for you. Your meal plan may vary depending on factors such as:  The calories you need.  The medicines you take.  Your weight.  Your blood glucose, blood pressure, and cholesterol levels.  Your activity level.  Other health conditions you have, such as heart or kidney disease. How do carbohydrates affect me? Carbohydrates, also called carbs, affect your blood glucose level more than any other type of  food. Eating carbs naturally raises the amount of glucose in your blood. Carb counting is a method for keeping track of how many carbs you eat. Counting carbs is important to keep your blood glucose at a healthy level, especially if you use insulin or take certain oral diabetes medicines. It is important to know how many carbs you can safely have in each meal. This is different for every person. Your dietitian can help you calculate how many carbs you should have at each meal and for each snack. Foods that contain carbs include:  Bread, cereal, rice, pasta, and  crackers.  Potatoes and corn.  Peas, beans, and lentils.  Milk and yogurt.  Fruit and juice.  Desserts, such as cakes, cookies, ice cream, and candy. How does alcohol affect me? Alcohol can cause a sudden decrease in blood glucose (hypoglycemia), especially if you use insulin or take certain oral diabetes medicines. Hypoglycemia can be a life-threatening condition. Symptoms of hypoglycemia (sleepiness, dizziness, and confusion) are similar to symptoms of having too much alcohol. If your health care provider says that alcohol is safe for you, follow these guidelines:  Limit alcohol intake to no more than 1 drink per day for nonpregnant women and 2 drinks per day for men. One drink equals 12 oz of beer, 5 oz of wine, or 1 oz of hard liquor.  Do not drink on an empty stomach.  Keep yourself hydrated with water, diet soda, or unsweetened iced tea.  Keep in mind that regular soda, juice, and other mixers may contain a lot of sugar and must be counted as carbs. What are tips for following this plan?  Reading food labels  Start by checking the serving size on the "Nutrition Facts" label of packaged foods and drinks. The amount of calories, carbs, fats, and other nutrients listed on the label is based on one serving of the item. Many items contain more than one serving per package.  Check the total grams (g) of carbs in one serving. You can calculate the number of servings of carbs in one serving by dividing the total carbs by 15. For example, if a food has 30 g of total carbs, it would be equal to 2 servings of carbs.  Check the number of grams (g) of saturated and trans fats in one serving. Choose foods that have low or no amount of these fats.  Check the number of milligrams (mg) of salt (sodium) in one serving. Most people should limit total sodium intake to less than 2,300 mg per day.  Always check the nutrition information of foods labeled as "low-fat" or "nonfat". These foods may be  higher in added sugar or refined carbs and should be avoided.  Talk to your dietitian to identify your daily goals for nutrients listed on the label. Shopping  Avoid buying canned, premade, or processed foods. These foods tend to be high in fat, sodium, and added sugar.  Shop around the outside edge of the grocery store. This includes fresh fruits and vegetables, bulk grains, fresh meats, and fresh dairy. Cooking  Use low-heat cooking methods, such as baking, instead of high-heat cooking methods like deep frying.  Cook using healthy oils, such as olive, canola, or sunflower oil.  Avoid cooking with butter, cream, or high-fat meats. Meal planning  Eat meals and snacks regularly, preferably at the same times every day. Avoid going long periods of time without eating.  Eat foods high in fiber, such as fresh fruits, vegetables, beans, and whole  grains. Talk to your dietitian about how many servings of carbs you can eat at each meal.  Eat 4-6 ounces (oz) of lean protein each day, such as lean meat, chicken, fish, eggs, or tofu. One oz of lean protein is equal to: ? 1 oz of meat, chicken, or fish. ? 1 egg. ?  cup of tofu.  Eat some foods each day that contain healthy fats, such as avocado, nuts, seeds, and fish. Lifestyle  Check your blood glucose regularly.  Exercise regularly as told by your health care provider. This may include: ? 150 minutes of moderate-intensity or vigorous-intensity exercise each week. This could be brisk walking, biking, or water aerobics. ? Stretching and doing strength exercises, such as yoga or weightlifting, at least 2 times a week.  Take medicines as told by your health care provider.  Do not use any products that contain nicotine or tobacco, such as cigarettes and e-cigarettes. If you need help quitting, ask your health care provider.  Work with a Social worker or diabetes educator to identify strategies to manage stress and any emotional and social  challenges. Questions to ask a health care provider  Do I need to meet with a diabetes educator?  Do I need to meet with a dietitian?  What number can I call if I have questions?  When are the best times to check my blood glucose? Where to find more information:  American Diabetes Association: diabetes.org  Academy of Nutrition and Dietetics: www.eatright.[employer] of Diabetes and Digestive and Kidney Diseases (NIH): [employer] Summary  A healthy meal plan will help you control your blood glucose and maintain a healthy lifestyle.  Working with a diet and nutrition specialist (dietitian) can help you make a meal plan that is best for you.  Keep in mind that carbohydrates (carbs) and alcohol have immediate effects on your blood glucose levels. It is important to count carbs and to use alcohol carefully. This information is not intended to replace advice given to you by your health care provider. Make sure you discuss any questions you have with your health care provider. Document Revised: 07/10/2017 Document Reviewed: 09/01/2016 Elsevier Patient Education  [place]. Metformin tablets What is this medicine? METFORMIN (met FOR min) is used to treat type 2 diabetes. It helps to control blood sugar. Treatment is combined with diet and exercise. This medicine can be used alone or with other medicines for diabetes. This medicine may be used for other purposes; ask your health care provider or pharmacist if you have questions. COMMON BRAND NAME(S): Glucophage What should I tell my health care provider before I take this medicine? They need to know if you have any of these conditions:  anemia  dehydration  heart disease  frequently drink alcohol-containing beverages  kidney disease  liver disease  polycystic ovary syndrome  serious infection or injury  vomiting  an unusual or allergic reaction to metformin, other medicines, foods, dyes, or  preservatives  pregnant or trying to get pregnant  breast-feeding How should I use this medicine? Take this medicine by mouth with a glass of water. Follow the directions on the prescription label. Take this medicine with food. Take your medicine at regular intervals. Do not take your medicine more often than directed. Do not stop taking except on your doctor's advice. Talk to your pediatrician regarding the use of this medicine in children. While this drug may be prescribed for children as young as 81 years of age for selected conditions, precautions  do apply. Overdosage: If you think you have taken too much of this medicine contact a poison control center or emergency room at once. NOTE: This medicine is only for you. Do not share this medicine with others. What if I miss a dose? If you miss a dose, take it as soon as you can. If it is almost time for your next dose, take only that dose. Do not take double or extra doses. What may interact with this medicine? Do not take this medicine with any of the following medications:  certain contrast medicines given before X-rays, CT scans, MRI, or other procedures  dofetilide This medicine may also interact with the following medications:  acetazolamide  alcohol  certain antivirals for HIV or hepatitis  certain medicines for blood pressure, heart disease, irregular heart beat  cimetidine  dichlorphenamide  digoxin  diuretics  male hormones, like estrogens or progestins and birth control pills  glycopyrrolate  isoniazid  lamotrigine  memantine  methazolamide  metoclopramide  midodrine  niacin  phenothiazines like chlorpromazine, mesoridazine, prochlorperazine, thioridazine  phenytoin  ranolazine  steroid medicines like prednisone or cortisone  stimulant medicines for attention disorders, weight loss, or to stay awake  thyroid medicines  topiramate  trospium  vandetanib  zonisamide This list may not  describe all possible interactions. Give your health care provider a list of all the medicines, herbs, non-prescription drugs, or dietary supplements you use. Also tell them if you smoke, drink alcohol, or use illegal drugs. Some items may interact with your medicine. What should I watch for while using this medicine? Visit your doctor or health care professional for regular checks on your progress. A test called the HbA1C (A1C) will be monitored. This is a simple blood test. It measures your blood sugar control over the last 2 to 3 months. You will receive this test every 3 to 6 months. Learn how to check your blood sugar. Learn the symptoms of low and high blood sugar and how to manage them. Always carry a quick-source of sugar with you in case you have symptoms of low blood sugar. Examples include hard sugar candy or glucose tablets. Make sure others know that you can choke if you eat or drink when you develop serious symptoms of low blood sugar, such as seizures or unconsciousness. They must get medical help at once. Tell your doctor or health care professional if you have high blood sugar. You might need to change the dose of your medicine. If you are sick or exercising more than usual, you might need to change the dose of your medicine. Do not skip meals. Ask your doctor or health care professional if you should avoid alcohol. Many nonprescription cough and cold products contain sugar or alcohol. These can affect blood sugar. This medicine may cause ovulation in premenopausal women who do not have regular monthly periods. This may increase your chances of becoming pregnant. You should not take this medicine if you become pregnant or think you may be pregnant. Talk with your doctor or health care professional about your birth control options while taking this medicine. Contact your doctor or health care professional right away if you think you are pregnant. If you are going to need surgery, a MRI, CT  scan, or other procedure, tell your doctor that you are taking this medicine. You may need to stop taking this medicine before the procedure. Wear a medical ID bracelet or chain, and carry a card that describes your disease and details of your  medicine and dosage times. This medicine may cause a decrease in folic acid and vitamin B12. You should make sure that you get enough vitamins while you are taking this medicine. Discuss the foods you eat and the vitamins you take with your health care professional. What side effects may I notice from receiving this medicine? Side effects that you should report to your doctor or health care professional as soon as possible:  allergic reactions like skin rash, itching or hives, swelling of the face, lips, or tongue  breathing problems  feeling faint or lightheaded, falls  muscle aches or pains  signs and symptoms of low blood sugar such as feeling anxious, confusion, dizziness, increased hunger, unusually weak or tired, sweating, shakiness, cold, irritable, headache, blurred vision, fast heartbeat, loss of consciousness  slow or irregular heartbeat  unusual stomach pain or discomfort  unusually tired or weak Side effects that usually do not require medical attention (report to your doctor or health care professional if they continue or are bothersome):  diarrhea  headache  heartburn  metallic taste in mouth  nausea  stomach gas, upset This list may not describe all possible side effects. Call your doctor for medical advice about side effects. You may report side effects to FDA at 1-800-FDA-1088. Where should I keep my medicine? Keep out of the reach of children. Store at room temperature between 15 and 30 degrees C (59 and 86 degrees F). Protect from moisture and light. Throw away any unused medicine after the expiration date. NOTE: This sheet is a summary. It may not cover all possible information. If you have questions about this  medicine, talk to your doctor, pharmacist, or health care provider.  2020 Elsevier/Gold Standard (2017-09-03 19:15:19)   Athlete's Foot  Athlete's foot (tinea pedis) is a fungal infection of the skin on your feet. It often occurs on the skin that is between or underneath the toes. It can also occur on the soles of your feet. The infection can spread from person to person (is contagious). It can also spread when a person's bare feet come in contact with the fungus on shower floors or on items such as shoes. What are the causes? This condition is caused by a fungus that grows in warm, moist places. You can get athlete's foot by sharing shoes, shower stalls, towels, and wet floors with someone who is infected. Not washing your feet or changing your socks often enough can also lead to athlete's foot. What increases the risk? This condition is more likely to develop in:  Men.  People who have a weak body defense system (immune system).  People who have diabetes.  People who use public showers, such as at a gym.  People who wear heavy-duty shoes, such as Environmental manager.  Seasons with warm, humid weather. What are the signs or symptoms? Symptoms of this condition include:  Itchy areas between your toes or on the soles of your feet.  White, flaky, or scaly areas between your toes or on the soles of your feet.  Very itchy small blisters between your toes or on the soles of your feet.  Small cuts in your skin. These cuts can become infected.  Thick or discolored toenails. How is this diagnosed? This condition may be diagnosed with a physical exam and a review of your medical history. Your health care provider may also take a skin or toenail sample to examine under a microscope. How is this treated? This condition is treated with  antifungal medicines. These may be applied as powders, ointments, or creams. In severe cases, an oral antifungal medicine may be given. Follow  these instructions at home: Medicines  Apply or take over-the-counter and prescription medicines only as told by your health care provider.  Apply your antifungal medicine as told by your health care provider. Do not stop using the antifungal even if your condition improves. Foot care  Do not scratch your feet.  Keep your feet dry: ? Wear cotton or wool socks. Change your socks every day or if they become wet. ? Wear shoes that allow air to flow, such as sandals or canvas tennis shoes.  Wash and dry your feet, including the area between your toes. Also, wash and dry your feet: ? Every day or as told by your health care provider. ? After exercising. General instructions  Do not let others use towels, shoes, nail clippers, or other personal items that touch your feet.  Protect your feet by wearing sandals in wet areas, such as locker rooms and shared showers.  Keep all follow-up visits as told by your health care provider. This is important.  If you have diabetes, keep your blood sugar under control. Contact a health care provider if:  You have a fever.  You have swelling, soreness, warmth, or redness in your foot.  Your feet are not getting better with treatment.  Your symptoms get worse.  You have new symptoms. Summary  Athlete's foot (tinea pedis) is a fungal infection of the skin on your feet. It often occurs on skin that is between or underneath the toes.  This condition is caused by a fungus that grows in warm, moist places.  Symptoms include white, flaky, or scaly areas between your toes or on the soles of your feet.  This condition is treated with antifungal medicines.  Keep your feet clean. Always dry them thoroughly. This information is not intended to replace advice given to you by your health care provider. Make sure you discuss any questions you have with your health care provider. Document Revised: 07/23/2017 Document Reviewed: 05/18/2017 Elsevier Patient  Education  [place] and Cholesterol Restricted Eating Plan Getting too much fat and cholesterol in your diet may cause health problems. Choosing the right foods helps keep your fat and cholesterol at normal levels. This can keep you from getting certain diseases. Your doctor may recommend an eating plan that includes:  Total fat: ______% or less of total calories a day.  Saturated fat: ______% or less of total calories a day.  Cholesterol: less than _________mg a day.  Fiber: ______g a day. What are tips for following this plan? Meal planning  At meals, divide your plate into four equal parts: ? Fill one-half of your plate with vegetables and green salads. ? Fill one-fourth of your plate with whole grains. ? Fill one-fourth of your plate with low-fat (lean) protein foods.  Eat fish that is high in omega-3 fats at least two times a week. This includes mackerel, tuna, sardines, and salmon.  Eat foods that are high in fiber, such as whole grains, beans, apples, broccoli, carrots, peas, and barley. General tips   Work with your doctor to lose weight if you need to.  Avoid: ? Foods with added sugar. ? Fried foods. ? Foods with partially hydrogenated oils.  Limit alcohol intake to no more than 1 drink a day for nonpregnant women and 2 drinks a day for men. One drink equals  12 oz of beer, 5 oz of wine, or 1 oz of hard liquor. Reading food labels  Check food labels for: ? Trans fats. ? Partially hydrogenated oils. ? Saturated fat (g) in each serving. ? Cholesterol (mg) in each serving. ? Fiber (g) in each serving.  Choose foods with healthy fats, such as: ? Monounsaturated fats. ? Polyunsaturated fats. ? Omega-3 fats.  Choose grain products that have whole grains. Look for the word "whole" as the first word in the ingredient list. Cooking  Cook foods using low-fat methods. These include baking, boiling, grilling, and broiling.  Eat more home-cooked  foods. Eat at restaurants and buffets less often.  Avoid cooking using saturated fats, such as butter, cream, palm oil, palm kernel oil, and coconut oil. Recommended foods  Fruits  All fresh, canned (in natural juice), or frozen fruits. Vegetables  Fresh or frozen vegetables (raw, steamed, roasted, or grilled). Green salads. Grains  Whole grains, such as whole wheat or whole grain breads, crackers, cereals, and pasta. Unsweetened oatmeal, bulgur, barley, quinoa, or brown rice. Corn or whole wheat flour tortillas. Meats and other protein foods  Ground beef (85% or leaner), grass-fed beef, or beef trimmed of fat. Skinless chicken or [place]. Ground chicken or [place]. Pork trimmed of fat. All fish and seafood. Egg whites. Dried beans, peas, or lentils. Unsalted nuts or seeds. Unsalted canned beans. Nut butters without added sugar or oil. Dairy  Low-fat or nonfat dairy products, such as skim or 1% milk, 2% or reduced-fat cheeses, low-fat and fat-free ricotta or cottage cheese, or plain low-fat and nonfat yogurt. Fats and oils  Tub margarine without trans fats. Light or reduced-fat mayonnaise and salad dressings. Avocado. Olive, canola, sesame, or safflower oils. The items listed above may not be a complete list of foods and beverages you can eat. Contact a dietitian for more information. Foods to avoid Fruits  Canned fruit in heavy syrup. Fruit in cream or butter sauce. Fried fruit. Vegetables  Vegetables cooked in cheese, cream, or butter sauce. Fried vegetables. Grains  White bread. White pasta. White rice. Cornbread. Bagels, pastries, and croissants. Crackers and snack foods that contain trans fat and hydrogenated oils. Meats and other protein foods  Fatty cuts of meat. Ribs, chicken wings, bacon, sausage, bologna, salami, chitterlings, fatback, hot dogs, bratwurst, and packaged lunch meats. Liver and organ meats. Whole eggs and egg yolks. Chicken and [place] with skin. Fried  meat. Dairy  Whole or 2% milk, cream, half-and-half, and cream cheese. Whole milk cheeses. Whole-fat or sweetened yogurt. Full-fat cheeses. Nondairy creamers and whipped toppings. Processed cheese, cheese spreads, and cheese curds. Beverages  Alcohol. Sugar-sweetened drinks such as sodas, lemonade, and fruit drinks. Fats and oils  Butter, stick margarine, lard, shortening, ghee, or bacon fat. Coconut, palm kernel, and palm oils. Sweets and desserts  Corn syrup, sugars, honey, and molasses. Candy. Jam and jelly. Syrup. Sweetened cereals. Cookies, pies, cakes, donuts, muffins, and ice cream. The items listed above may not be a complete list of foods and beverages you should avoid. Contact a dietitian for more information. Summary  Choosing the right foods helps keep your fat and cholesterol at normal levels. This can keep you from getting certain diseases.  At meals, fill one-half of your plate with vegetables and green salads.  Eat high-fiber foods, like whole grains, beans, apples, carrots, peas, and barley.  Limit added sugar, saturated fats, alcohol, and fried foods. This information is not intended to replace advice given to you by your health  care provider. Make sure you discuss any questions you have with your health care provider. Document Revised: 03/31/2018 Document Reviewed: 04/14/2017 Elsevier Patient Education  [employer].

## 2020-01-13 ENCOUNTER — Ambulatory Visit: Payer: 59 | Attending: Internal Medicine

## 2020-01-13 DIAGNOSIS — Z23 Encounter for immunization: Secondary | ICD-10-CM

## 2020-01-13 NOTE — Progress Notes (Signed)
   Covid-19 Vaccination Clinic  Name:  Yoniel Arkwright    MRN: [id number] DOB: 14-Feb-1976  01/13/2020  Mr. Duer was observed post Covid-19 immunization for 15 minutes without incident. He was provided with Vaccine Information Sheet and instruction to access the V-Safe system.   Mr. Ehrman was instructed to call 911 with any severe reactions post vaccine: Marland Kitchen Difficulty breathing  . Swelling of face and throat  . A fast heartbeat  . A bad rash all over body  . Dizziness and weakness   Immunizations Administered    Name Date Dose VIS Date Route   Pfizer COVID-19 Vaccine 01/13/2020 11:37 AM 0.3 mL 10/05/2018 Intramuscular   Manufacturer: [employer], [employer]   Lot: [id number]   NDC: [address]-2

## 2020-01-19 DIAGNOSIS — H5213 Myopia, bilateral: Secondary | ICD-10-CM | POA: Diagnosis not present

## 2020-01-19 LAB — HM DIABETES EYE EXAM

## 2020-02-04 ENCOUNTER — Ambulatory Visit: Payer: 59

## 2020-04-06 ENCOUNTER — Ambulatory Visit: Payer: Self-pay | Admitting: Adult Health

## 2020-04-12 ENCOUNTER — Ambulatory Visit: Payer: Self-pay | Admitting: Adult Health

## 2020-04-25 NOTE — Progress Notes (Signed)
Established patient visit   Patient: Austin Serrano   DOB: [birth year]   44 y.o. Male  MRN: [id number] Visit Date: 04/26/2020  Today's healthcare provider: Trinna Post, PA-C   Chief Complaint  Patient presents with  . Diabetes  I,Chevon Fomby M Zariana Strub,acting as a scribe for [employer], PA-C.,have documented all relevant documentation on the behalf of Trinna Post, PA-C,as directed by  Trinna Post, PA-C while in the presence of Trinna Post, PA-C.  Subjective    HPI  Diabetes Mellitus Type II, Follow-up  Lab Results  Component Value Date   HGBA1C 5.8 (A) 04/26/2020   Wt Readings from Last 3 Encounters:  04/26/20 228 lb 6.4 oz (103.6 kg)  01/05/20 246 lb 6.4 oz (111.8 kg)  11/25/19 230 lb (104.3 kg)   Last seen for diabetes 3 months ago.  Management since then includes taking metformin 500 mg BID. Patient reports he is taking it only once per day but his fasting sugars have been 90-110.  He reports good compliance with treatment. He is not having side effects.  Symptoms: No fatigue No foot ulcerations  No appetite changes No nausea  No paresthesia of the feet  No polydipsia  No polyuria No visual disturbances   No vomiting     Home blood sugar records: fasting range: 91 this morning  Episodes of hypoglycemia? No    Current insulin regiment: none Most Recent Eye Exam:  Current exercise: walking and weightlifting Current diet habits: well balanced  Pertinent Labs: No results found for: CHOL, HDL, LDLCALC, LDLDIRECT, TRIG, CHOLHDL Lab Results  Component Value Date   NA 129 (L) 11/25/2019   K 4.3 11/25/2019   CREATININE 1.22 11/25/2019   GFRNONAA >60 11/25/2019   GFRAA >60 11/25/2019   GLUCOSE 730 ([place]) 11/25/2019     ---------------------------------------------------------------------------------------------------  Patient reports a left sided headache that is persistent and making his eye water. Reports some scalp tenderness. Denies  jaw claudication. Is taking tylenol for this but it is ineffective.     Medications: Outpatient Medications Prior to Visit  Medication Sig  . albuterol (PROVENTIL HFA;VENTOLIN HFA) 108 (90 Base) MCG/ACT inhaler Inhale 2 puffs into the lungs every 6 (six) hours as needed for wheezing or shortness of breath.  . blood glucose meter kit and supplies Dispense based on patient and insurance preference. Use up to four times daily as directed. (FOR ICD-10 E10.9, E11.9).  . chlorpheniramine-HYDROcodone (TUSSIONEX PENNKINETIC ER) 10-8 MG/5ML SUER Take 5 mLs by mouth at bedtime as needed for cough.  . clindamycin (CLEOCIN) 300 MG capsule Take 1 capsule (300 mg total) by mouth 3 (three) times daily.  . fluticasone-salmeterol (ADVAIR HFA) 115-21 MCG/ACT inhaler Inhale 2 puffs into the lungs 2 (two) times daily. RINSE MOUTH AFTER USE. USE DAILY.  Marland Kitchen ketoconazole (NIZORAL) 2 % shampoo Apply 1 application topically 2 (two) times a week. Lather and wash both feet.  Leave on for around 10 minutes. For foot fungus.Do not use on any open sores.  . methylPREDNISolone (MEDROL DOSEPAK) 4 MG TBPK tablet Take Tapered dose as directed  . methylPREDNISolone (MEDROL DOSEPAK) 4 MG TBPK tablet Take Tapered dose as directed  . metFORMIN (GLUCOPHAGE) 500 MG tablet Take 1 tablet (500 mg total) by mouth 2 (two) times daily with a meal.   No facility-administered medications prior to visit.    Review of Systems    Objective    BP 132/82 (BP Location: Left Arm, Patient Position: Sitting, Cuff Size:  Large)   Pulse 80   Temp 98.1 F (36.7 C) (Oral)   Wt 228 lb 6.4 oz (103.6 kg)   SpO2 98%   BMI 36.86 kg/m    Physical Exam Constitutional:      Appearance: Normal appearance.  Cardiovascular:     Rate and Rhythm: Normal rate and regular rhythm.     Heart sounds: Normal heart sounds.  Pulmonary:     Effort: Pulmonary effort is normal.     Breath sounds: Normal breath sounds.  Skin:    General: Skin is warm and dry.   Neurological:     Mental Status: He is alert and oriented to person, place, and time. Mental status is at baseline.  Psychiatric:        Mood and Affect: Mood normal.        Behavior: Behavior normal.       Results for orders placed or performed in visit on 04/26/20  POCT glycosylated hemoglobin (Hb A1C)  Result Value Ref Range   Hemoglobin A1C 5.8 (A) 4.0 - 5.6 %   HbA1c POC (<> result, manual entry)     HbA1c, POC (prediabetic range)     HbA1c, POC (controlled diabetic range)     Est. average glucose Bld gHb Est-mCnc 120     Assessment & Plan    1. Diabetes mellitus without complication (HCC)  Follow up 3 months, due for foot exam. Change to metformin 500 mg QD.  - POCT glycosylated hemoglobin (Hb A1C) - Comprehensive Metabolic Panel (CMET) - Lipid Profile - Urine Microalbumin w/creat. ratio  2. Other migraine without status migrainosus, not intractable  - SUMAtriptan (IMITREX) 50 MG tablet; Take 1 tablet (50 mg total) by mouth every 2 (two) hours as needed for migraine. May repeat in 2 hours if headache persists or recurs.  Dispense: 10 tablet; Refill: 0  3. Need for vaccination against Streptococcus pneumoniae  - Pneumococcal polysaccharide vaccine 23-valent greater than or equal to 2yo subcutaneous/IM    No follow-ups on file.      I, Adriana M Pollak, PA-C, have reviewed all documentation for this visit. The documentation on 04/26/20 for the exam, diagnosis, procedures, and orders are all accurate and complete.  The entirety of the information documented in the History of Present Illness, Review of Systems and Physical Exam were personally obtained by me. Portions of this information were initially documented by Porsha McClurkin and reviewed by me for thoroughness and accuracy.     Adriana M Pollak, PA-C   Family Practice [phone] (phone) [phone] (fax)  [facility]  

## 2020-04-26 ENCOUNTER — Encounter: Payer: Self-pay | Admitting: Physician Assistant

## 2020-04-26 ENCOUNTER — Other Ambulatory Visit: Payer: Self-pay

## 2020-04-26 ENCOUNTER — Ambulatory Visit: Payer: 59 | Admitting: Physician Assistant

## 2020-04-26 ENCOUNTER — Ambulatory Visit: Payer: Self-pay | Admitting: Adult Health

## 2020-04-26 VITALS — BP 132/82 | HR 80 | Temp 98.1°F | Wt 228.4 lb

## 2020-04-26 DIAGNOSIS — E119 Type 2 diabetes mellitus without complications: Secondary | ICD-10-CM

## 2020-04-26 DIAGNOSIS — Z23 Encounter for immunization: Secondary | ICD-10-CM

## 2020-04-26 DIAGNOSIS — G44019 Episodic cluster headache, not intractable: Secondary | ICD-10-CM | POA: Diagnosis not present

## 2020-04-26 LAB — POCT GLYCOSYLATED HEMOGLOBIN (HGB A1C)
Est. average glucose Bld gHb Est-mCnc: 120
Hemoglobin A1C: 5.8 % — AB (ref 4.0–5.6)

## 2020-04-26 MED ORDER — SUMATRIPTAN SUCCINATE 50 MG PO TABS: 50.0000 mg | ORAL_TABLET | ORAL | 0 refills | Status: DC | PRN

## 2020-04-27 ENCOUNTER — Encounter: Payer: Self-pay | Admitting: Physician Assistant

## 2020-04-27 ENCOUNTER — Other Ambulatory Visit: Payer: Self-pay

## 2020-04-27 LAB — LIPID PANEL
Chol/HDL Ratio: 5 ratio (ref 0.0–5.0)
Cholesterol, Total: 176 mg/dL (ref 100–199)
HDL: 35 mg/dL — ABNORMAL LOW (ref >39)
LDL Chol Calc (NIH): 116 mg/dL — ABNORMAL HIGH (ref 0–99)
Triglycerides: 137 mg/dL (ref 0–149)
VLDL Cholesterol Cal: 25 mg/dL (ref 5–40)

## 2020-04-27 LAB — COMPREHENSIVE METABOLIC PANEL
ALT: 14 IU/L (ref 0–44)
AST: 15 IU/L (ref 0–40)
Albumin/Globulin Ratio: 1.5 (ref 1.2–2.2)
Albumin: 4.5 g/dL (ref 4.0–5.0)
Alkaline Phosphatase: 75 IU/L (ref 44–121)
BUN/Creatinine Ratio: 11 (ref 9–20)
BUN: 12 mg/dL (ref 6–24)
Bilirubin Total: 0.3 mg/dL (ref 0.0–1.2)
CO2: 24 mmol/L (ref 20–29)
Calcium: 9.7 mg/dL (ref 8.7–10.2)
Chloride: 102 mmol/L (ref 96–106)
Creatinine, Ser: 1.13 mg/dL (ref 0.76–1.27)
GFR calc Af Amer: 93 mL/min/{1.73_m2} (ref >59)
GFR calc non Af Amer: 80 mL/min/{1.73_m2} (ref >59)
Globulin, Total: 3 g/dL (ref 1.5–4.5)
Glucose: 80 mg/dL (ref 65–99)
Potassium: 4.6 mmol/L (ref 3.5–5.2)
Sodium: 141 mmol/L (ref 134–144)
Total Protein: 7.5 g/dL (ref 6.0–8.5)

## 2020-04-27 LAB — MICROALBUMIN / CREATININE URINE RATIO
Creatinine, Urine: 343.2 mg/dL
Microalb/Creat Ratio: 4 mg/g creat (ref 0–29)
Microalbumin, Urine: 12.7 ug/mL

## 2020-04-27 NOTE — Telephone Encounter (Signed)
Please advise refill? Never filled by pcp.  °

## 2020-04-30 ENCOUNTER — Telehealth: Payer: Self-pay

## 2020-04-30 ENCOUNTER — Other Ambulatory Visit: Payer: Self-pay | Admitting: Physician Assistant

## 2020-04-30 DIAGNOSIS — E1169 Type 2 diabetes mellitus with other specified complication: Secondary | ICD-10-CM

## 2020-04-30 DIAGNOSIS — E119 Type 2 diabetes mellitus without complications: Secondary | ICD-10-CM

## 2020-04-30 MED ORDER — METFORMIN HCL 500 MG PO TABS: 500.0000 mg | ORAL_TABLET | Freq: Two times a day (BID) | ORAL | 1 refills | Status: DC

## 2020-04-30 MED ORDER — ATORVASTATIN CALCIUM 10 MG PO TABS: 10.0000 mg | ORAL_TABLET | Freq: Every day | ORAL | 0 refills | Status: DC

## 2020-04-30 NOTE — Telephone Encounter (Signed)
L.O.V. was on 04/26/2020 and next appointment 07/26/2020. Medication refill send into pharmacy.

## 2020-04-30 NOTE — Telephone Encounter (Signed)
Patient agreed with starting a cholesterol medication and Lipitor 10 mg was send into pharmacy.

## 2020-07-25 NOTE — Progress Notes (Deleted)
Established patient visit   Patient: Austin Serrano   DOB: 1975-12-14   44 y.o. Male  MRN: [id number] Visit Date: 07/26/2020  Today's healthcare provider: Trinna Post, PA-C   No chief complaint on file.  Subjective    HPI  Diabetes Mellitus Type II, Follow-up  Lab Results  Component Value Date   HGBA1C 5.8 (A) 04/26/2020   Wt Readings from Last 3 Encounters:  04/26/20 228 lb 6.4 oz (103.6 kg)  01/05/20 246 lb 6.4 oz (111.8 kg)  11/25/19 230 lb (104.3 kg)   Last seen for diabetes 3 months ago.  Management since then includes changed to metformin 500 mg QD. He reports {excellent/good/fair/poor:19665} compliance with treatment. He {is/is not:21021397} having side effects. {document side effects if present:1} Symptoms: {Yes/No:20286} fatigue {Yes/No:20286} foot ulcerations  {Yes/No:20286} appetite changes {Yes/No:20286} nausea  {Yes/No:20286} paresthesia of the feet  {Yes/No:20286} polydipsia  {Yes/No:20286} polyuria {Yes/No:20286} visual disturbances   {Yes/No:20286} vomiting     Home blood sugar records: {diabetes glucometry results:16657}  Episodes of hypoglycemia? {Yes/No:20286} {enter symptoms and frequency of symptoms if yes:1}   Current insulin regiment: {enter 'none' or type of insulin and number of units taken with each dose of each insulin formulation that the patient is taking:1} Most Recent Eye Exam: *** {Current exercise:16438:::1} {Current diet habits:16563:::1}  Pertinent Labs: Lab Results  Component Value Date   CHOL 176 04/26/2020   HDL 35 (L) 04/26/2020   LDLCALC 116 (H) 04/26/2020   TRIG 137 04/26/2020   CHOLHDL 5.0 04/26/2020   Lab Results  Component Value Date   NA 141 04/26/2020   K 4.6 04/26/2020   CREATININE 1.13 04/26/2020   GFRNONAA 80 04/26/2020   GFRAA 93 04/26/2020   GLUCOSE 80 04/26/2020     ---------------------------------------------------------------------------------------------------   {Show patient  history (optional):23778::" "}   Medications: Outpatient Medications Prior to Visit  Medication Sig  . albuterol (PROVENTIL HFA;VENTOLIN HFA) 108 (90 Base) MCG/ACT inhaler Inhale 2 puffs into the lungs every 6 (six) hours as needed for wheezing or shortness of breath.  Marland Kitchen atorvastatin (LIPITOR) 10 MG tablet Take 1 tablet (10 mg total) by mouth at bedtime.  . blood glucose meter kit and supplies Dispense based on patient and insurance preference. Use up to four times daily as directed. (FOR ICD-10 E10.9, E11.9).  . chlorpheniramine-HYDROcodone (TUSSIONEX PENNKINETIC ER) 10-8 MG/5ML SUER Take 5 mLs by mouth at bedtime as needed for cough.  . clindamycin (CLEOCIN) 300 MG capsule Take 1 capsule (300 mg total) by mouth 3 (three) times daily.  . fluticasone-salmeterol (ADVAIR HFA) 115-21 MCG/ACT inhaler Inhale 2 puffs into the lungs 2 (two) times daily. RINSE MOUTH AFTER USE. USE DAILY.  Marland Kitchen ketoconazole (NIZORAL) 2 % shampoo Apply 1 application topically 2 (two) times a week. Lather and wash both feet.  Leave on for around 10 minutes. For foot fungus.Do not use on any open sores.  . metFORMIN (GLUCOPHAGE) 500 MG tablet Take 1 tablet (500 mg total) by mouth 2 (two) times daily with a meal.  . methylPREDNISolone (MEDROL DOSEPAK) 4 MG TBPK tablet Take Tapered dose as directed  . methylPREDNISolone (MEDROL DOSEPAK) 4 MG TBPK tablet Take Tapered dose as directed  . SUMAtriptan (IMITREX) 50 MG tablet Take 1 tablet (50 mg total) by mouth every 2 (two) hours as needed for migraine. May repeat in 2 hours if headache persists or recurs.   No facility-administered medications prior to visit.    Review of Systems  Constitutional: Negative.  Respiratory: Negative.   Cardiovascular: Negative.   Hematological: Negative.     {Heme  Chem  Endocrine  Serology  Results Review (optional):23779::" "}  Objective    There were no vitals taken for this visit. {Show previous vital signs (optional):23777::"  "}  Physical Exam  ***  No results found for any visits on 07/26/20.  Assessment & Plan     ***  No follow-ups on file.      {provider attestation***:1}   Paulene Floor  [facility] [phone] (phone) [phone] (fax)  [place]

## 2020-07-26 ENCOUNTER — Ambulatory Visit: Payer: Self-pay | Admitting: Physician Assistant

## 2020-08-06 NOTE — Progress Notes (Signed)
Established patient visit   Patient: Austin Serrano   DOB: 12-09-1975   44 y.o. Male  MRN: [id number] Visit Date: 08/07/2020  Today's healthcare provider: Trinna Post, PA-C   Chief Complaint  Patient presents with  . Diabetes  I,Erasto Sleight M Chrishana Spargur,acting as a scribe for [employer], PA-C.,have documented all relevant documentation on the behalf of Trinna Post, PA-C,as directed by  Trinna Post, PA-C while in the presence of Trinna Post, PA-C.   Subjective    HPI  Diabetes Mellitus Type II, Follow-up  Lab Results  Component Value Date   HGBA1C 5.6 08/07/2020   HGBA1C 5.8 (A) 04/26/2020   Wt Readings from Last 3 Encounters:  08/07/20 228 lb 6.4 oz (103.6 kg)  04/26/20 228 lb 6.4 oz (103.6 kg)  01/05/20 246 lb 6.4 oz (111.8 kg)   Last seen for diabetes 3 months ago.  Management since then includes changed medication to metformin 500 mg QD. He is not taking metformin every day - when he feels like he needs it, he will take it.  He is not having side effects.  Symptoms: No fatigue No foot ulcerations  No appetite changes No nausea  No paresthesia of the feet  No polydipsia  No polyuria No visual disturbances   No vomiting     Home blood sugar records: fasting range: 90's-100's(low)  Episodes of hypoglycemia? No    Current insulin regiment: none Most Recent Eye Exam: 01/2020 normal Current exercise: none Current diet habits: keto  Pertinent Labs: Lab Results  Component Value Date   CHOL 176 04/26/2020   HDL 35 (L) 04/26/2020   LDLCALC 116 (H) 04/26/2020   TRIG 137 04/26/2020   CHOLHDL 5.0 04/26/2020   Lab Results  Component Value Date   NA 141 04/26/2020   K 4.6 04/26/2020   CREATININE 1.13 04/26/2020   GFRNONAA 80 04/26/2020   GFRAA 93 04/26/2020   GLUCOSE 80 04/26/2020     ---------------------------------------------------------------------------------------------------  Lipid/Cholesterol, Follow-up  Last lipid panel  Other pertinent labs  Lab Results  Component Value Date   CHOL 176 04/26/2020   HDL 35 (L) 04/26/2020   LDLCALC 116 (H) 04/26/2020   TRIG 137 04/26/2020   CHOLHDL 5.0 04/26/2020   Lab Results  Component Value Date   ALT 14 04/26/2020   AST 15 04/26/2020   PLT 260 11/25/2019     He was last seen for this 3 months ago.  Management since that visit includes starting statin.  He reports good compliance with treatment. He is not having side effects.   Symptoms: No chest pain No chest pressure/discomfort  No dyspnea No lower extremity edema  No numbness or tingling of extremity No orthopnea  No palpitations No paroxysmal nocturnal dyspnea  No speech difficulty No syncope   Current diet: well balanced Current exercise: none  The 10-year ASCVD risk score Mikey Bussing DC Jr., et al., 2013) is: 9.8%  ---------------------------------------------------------------------------------------------------   Reports the imitrex helps for a short period but headaches returned. Currently managing with ibuprofen.      Medications: Outpatient Medications Prior to Visit  Medication Sig  . albuterol (PROVENTIL HFA;VENTOLIN HFA) 108 (90 Base) MCG/ACT inhaler Inhale 2 puffs into the lungs every 6 (six) hours as needed for wheezing or shortness of breath.  . blood glucose meter kit and supplies Dispense based on patient and insurance preference. Use up to four times daily as directed. (FOR ICD-10 E10.9, E11.9).  . chlorpheniramine-HYDROcodone (TUSSIONEX PENNKINETIC  ER) 10-8 MG/5ML SUER Take 5 mLs by mouth at bedtime as needed for cough.  . clindamycin (CLEOCIN) 300 MG capsule Take 1 capsule (300 mg total) by mouth 3 (three) times daily.  . fluticasone-salmeterol (ADVAIR HFA) 115-21 MCG/ACT inhaler Inhale 2 puffs into the lungs 2 (two) times daily. RINSE MOUTH AFTER USE. USE DAILY.  Marland Kitchen ketoconazole (NIZORAL) 2 % shampoo Apply 1 application topically 2 (two) times a week. Lather and wash both feet.  Leave  on for around 10 minutes. For foot fungus.Do not use on any open sores.  . methylPREDNISolone (MEDROL DOSEPAK) 4 MG TBPK tablet Take Tapered dose as directed  . methylPREDNISolone (MEDROL DOSEPAK) 4 MG TBPK tablet Take Tapered dose as directed  . SUMAtriptan (IMITREX) 50 MG tablet Take 1 tablet (50 mg total) by mouth every 2 (two) hours as needed for migraine. May repeat in 2 hours if headache persists or recurs.  . [DISCONTINUED] atorvastatin (LIPITOR) 10 MG tablet Take 1 tablet (10 mg total) by mouth at bedtime.  . metFORMIN (GLUCOPHAGE) 500 MG tablet Take 1 tablet (500 mg total) by mouth 2 (two) times daily with a meal.   No facility-administered medications prior to visit.    Review of Systems  Constitutional: Negative.   Respiratory: Negative.   Cardiovascular: Negative.   Hematological: Negative.       Objective    BP 115/84 (BP Location: Left Arm, Patient Position: Sitting, Cuff Size: Large)   Pulse 89   Temp 98.5 F (36.9 C) (Oral)   Wt 228 lb 6.4 oz (103.6 kg)   SpO2 98%   BMI 36.86 kg/m    Physical Exam Constitutional:      Appearance: Normal appearance. He is obese.  Cardiovascular:     Rate and Rhythm: Normal rate and regular rhythm.     Heart sounds: Normal heart sounds.  Pulmonary:     Effort: Pulmonary effort is normal.     Breath sounds: Normal breath sounds.  Skin:    General: Skin is warm and dry.  Neurological:     General: No focal deficit present.     Mental Status: He is alert and oriented to person, place, and time. Mental status is at baseline.  Psychiatric:        Mood and Affect: Mood normal.        Behavior: Behavior normal.       Results for orders placed or performed in visit on 08/07/20  POCT glycosylated hemoglobin (Hb A1C)  Result Value Ref Range   Hemoglobin A1C 5.6 4.0 - 5.6 %   HbA1c POC (<> result, manual entry)     HbA1c, POC (prediabetic range)     HbA1c, POC (controlled diabetic range)     Est. average glucose Bld gHb  Est-mCnc 114     Assessment & Plan    1. Diabetes mellitus without complication ([place])  Continue metformin for now but may wean off due to good glycemic control. - POCT glycosylated hemoglobin (Hb A1C)  2. Cluster headache, not intractable, unspecified chronicity pattern  Persists. Discussed prophylaxis, he is not interested in propranolol or SSRI. Wrote down list of potential medications. May also consider head imaging and/or referral to neuro.  3. Need for tetanus booster  Reports he had this in the past ten years.  4. Hyperlipidemia associated with type 2 diabetes mellitus ([place])  Intermittently taking statin. Continue.  - atorvastatin (LIPITOR) 10 MG tablet; Take 1 tablet (10 mg total) by mouth at bedtime.  Dispense:  90 tablet; Refill: 1   No follow-ups on file.      ITrinna Post, PA-C, have reviewed all documentation for this visit. The documentation on 08/07/20 for the exam, diagnosis, procedures, and orders are all accurate and complete.  The entirety of the information documented in the History of Present Illness, Review of Systems and Physical Exam were personally obtained by me. Portions of this information were initially documented by [facility] and reviewed by me for thoroughness and accuracy.     Paulene Floor  [facility] [phone] (phone) [phone] (fax)  [place]

## 2020-08-07 ENCOUNTER — Ambulatory Visit: Payer: 59 | Admitting: Physician Assistant

## 2020-08-07 ENCOUNTER — Encounter: Payer: Self-pay | Admitting: Physician Assistant

## 2020-08-07 ENCOUNTER — Other Ambulatory Visit: Payer: Self-pay

## 2020-08-07 VITALS — BP 115/84 | HR 89 | Temp 98.5°F | Wt 228.4 lb

## 2020-08-07 DIAGNOSIS — E785 Hyperlipidemia, unspecified: Secondary | ICD-10-CM | POA: Diagnosis not present

## 2020-08-07 DIAGNOSIS — E119 Type 2 diabetes mellitus without complications: Secondary | ICD-10-CM | POA: Diagnosis not present

## 2020-08-07 DIAGNOSIS — G44009 Cluster headache syndrome, unspecified, not intractable: Secondary | ICD-10-CM

## 2020-08-07 DIAGNOSIS — Z23 Encounter for immunization: Secondary | ICD-10-CM

## 2020-08-07 DIAGNOSIS — E1169 Type 2 diabetes mellitus with other specified complication: Secondary | ICD-10-CM | POA: Diagnosis not present

## 2020-08-07 LAB — POCT GLYCOSYLATED HEMOGLOBIN (HGB A1C)
Est. average glucose Bld gHb Est-mCnc: 114
Hemoglobin A1C: 5.6 % (ref 4.0–5.6)

## 2020-08-07 MED ORDER — ATORVASTATIN CALCIUM 10 MG PO TABS: 10.0000 mg | ORAL_TABLET | Freq: Every day | ORAL | 1 refills | Status: DC

## 2020-08-07 NOTE — Patient Instructions (Addendum)
Medicines that we use to prevent headaches:   Propranolol  effexor zoloft cymbalta topamax  Elavil

## 2020-12-22 ENCOUNTER — Other Ambulatory Visit: Payer: Self-pay

## 2020-12-22 ENCOUNTER — Emergency Department
Admission: EM | Admit: 2020-12-22 | Discharge: 2020-12-22 | Disposition: A | Discharge status: Home / Self Care | Payer: 59 | Attending: Emergency Medicine | Admitting: Emergency Medicine

## 2020-12-22 DIAGNOSIS — H9201 Otalgia, right ear: Secondary | ICD-10-CM | POA: Diagnosis not present

## 2020-12-22 DIAGNOSIS — R0981 Nasal congestion: Secondary | ICD-10-CM | POA: Diagnosis present

## 2020-12-22 DIAGNOSIS — Z5321 Procedure and treatment not carried out due to patient leaving prior to being seen by health care provider: Secondary | ICD-10-CM | POA: Insufficient documentation

## 2020-12-22 DIAGNOSIS — J029 Acute pharyngitis, unspecified: Secondary | ICD-10-CM | POA: Diagnosis not present

## 2020-12-22 NOTE — ED Triage Notes (Signed)
Pt states nasal congestion that began yesterday with associated sore throat and right ear pain. Pt with dry cough noted in triage. Denies fever at home.

## 2020-12-22 NOTE — ED Notes (Signed)
No answer when called x2 in lobby.

## 2020-12-22 NOTE — ED Notes (Signed)
No answer on phonenumber listed in chart.

## 2020-12-22 NOTE — ED Notes (Signed)
No answer when called x3 in lobby.

## 2020-12-22 NOTE — ED Notes (Signed)
No answer when called in lobby x1.

## 2021-02-05 ENCOUNTER — Ambulatory Visit: Payer: Self-pay | Admitting: Physician Assistant

## 2021-02-07 ENCOUNTER — Ambulatory Visit: Payer: Self-pay | Admitting: Family Medicine

## 2021-10-31 ENCOUNTER — Encounter: Payer: Self-pay | Admitting: Physician Assistant

## 2021-10-31 ENCOUNTER — Other Ambulatory Visit: Payer: Self-pay

## 2021-10-31 ENCOUNTER — Ambulatory Visit: Payer: BC Managed Care – PPO | Admitting: Physician Assistant

## 2021-10-31 VITALS — BP 146/100 | HR 87 | Temp 98.1°F | Ht 66.0 in | Wt 239.0 lb

## 2021-10-31 DIAGNOSIS — F17209 Nicotine dependence, unspecified, with unspecified nicotine-induced disorders: Secondary | ICD-10-CM

## 2021-10-31 DIAGNOSIS — I10 Essential (primary) hypertension: Secondary | ICD-10-CM | POA: Diagnosis not present

## 2021-10-31 DIAGNOSIS — B369 Superficial mycosis, unspecified: Secondary | ICD-10-CM

## 2021-10-31 DIAGNOSIS — R009 Unspecified abnormalities of heart beat: Secondary | ICD-10-CM | POA: Diagnosis not present

## 2021-10-31 DIAGNOSIS — R030 Elevated blood-pressure reading, without diagnosis of hypertension: Secondary | ICD-10-CM

## 2021-10-31 DIAGNOSIS — B353 Tinea pedis: Secondary | ICD-10-CM

## 2021-10-31 DIAGNOSIS — E119 Type 2 diabetes mellitus without complications: Secondary | ICD-10-CM

## 2021-10-31 DIAGNOSIS — Z23 Encounter for immunization: Secondary | ICD-10-CM

## 2021-10-31 DIAGNOSIS — L729 Follicular cyst of the skin and subcutaneous tissue, unspecified: Secondary | ICD-10-CM

## 2021-10-31 DIAGNOSIS — E785 Hyperlipidemia, unspecified: Secondary | ICD-10-CM

## 2021-10-31 DIAGNOSIS — J45909 Unspecified asthma, uncomplicated: Secondary | ICD-10-CM

## 2021-10-31 LAB — POCT GLYCOSYLATED HEMOGLOBIN (HGB A1C)

## 2021-10-31 MED ORDER — AMLODIPINE BESYLATE 5 MG PO TABS: 5.0000 mg | ORAL_TABLET | Freq: Every day | ORAL | 0 refills | Status: DC

## 2021-10-31 MED ORDER — METFORMIN HCL 500 MG PO TABS: ORAL_TABLET | Freq: Two times a day (BID) | ORAL | 1 refills | Status: DC

## 2021-10-31 MED ORDER — TERBINAFINE HCL 250 MG PO TABS: 250.0000 mg | ORAL_TABLET | Freq: Every day | ORAL | 0 refills | Status: DC

## 2021-10-31 NOTE — Progress Notes (Signed)
?  ?I,Asley Baskerville Serrano,acting as a Education administrator for [employer], PA-C.,have documented all relevant documentation on the behalf of Austin Speak, PA-C,as directed by  [employer], PA-C while in the presence of [employer], PA-C. ? ? ? ?Established patient visit ? ? ?Patient: Austin Serrano   DOB: Oct 11, 1975   46 y.o. Male  MRN: [id number] ?Visit Date: 10/31/2021 ? ?Today's healthcare provider: [name], PA-C  ? ?Patient presents today for DM, HDL FU ? ?Subjective  ?  ?HPI  ?Diabetes Mellitus Type II, follow-up ? ?Lab Results  ?Component Value Date  ? HGBA1C 5.6 08/07/2020  ? HGBA1C 5.8 (A) 04/26/2020  ? ?Last seen for diabetes over 1.3 years ago.  ?Management since then includes continuing the same treatment. Patient reports he is not taking medication. ?He reports poor compliance with treatment. ?He is not having side effects. ?Home blood sugar records:  average 105-197 fasting  ?    Per chart review, he was seen at [facility] ED on 11/26/19 with complaints of loss of vision and increased urination. Glucose reading in hospital was > 600 and he was started on Metformin 528m BID.   ?Patient reports that he tried a keto diet in the past. ?     Episodes of hypoglycemia? Yes nausea without eating  ?  ?Current insulin regimen: none  ?Most Recent Eye Exam: not in the past yr ? ?HDL ? ?Last Lipid Panel: ?Lab Results  ?Component Value Date  ? CHOL 176 04/26/2020  ? LDLCALC 116 (H) 04/26/2020  ? HDL 35 (L) 04/26/2020  ? TRIG 137 04/26/2020  ? ? ?He was last seen for this over 1.3 years ago.  ?Management since that visit includes no changes.  Pt is not on medication. In the past, took statin. ? ?He reports poor compliance with treatment. ?He is not having side effects. ? ?Symptoms: ?No appetite changes Yes foot ulcerations  ?No chest pain No chest pressure/discomfort  ?No dyspnea No orthopnea  ?Yes fatigue No lower extremity edema  ?No palpitations No paroxysmal nocturnal dyspnea  ?No nausea Yes numbness or tingling of extremity   ?No polydipsia No polyuria  ?No speech difficulty No syncope  ? ?He is following a Regular diet. ?Current exercise: walking ? ?Last metabolic panel ?Lab Results  ?Component Value Date  ? GLUCOSE 80 04/26/2020  ? NA 141 04/26/2020  ? K 4.6 04/26/2020  ? BUN 12 04/26/2020  ? CREATININE 1.13 04/26/2020  ? GFRNONAA 80 04/26/2020  ? CALCIUM 9.7 04/26/2020  ? AST 15 04/26/2020  ? ALT 14 04/26/2020  ? ?The 10-year ASCVD risk score (Arnett DK, et al., 2019) is: 13.7% ? ? ?Asthma ?- Taking albuterol as needed, not maintenance inhaler. In the past used Advair. Last refill was 12/30/19 ?- ED visits/hospitalization in the last 6 months: yes? There is records of phone conversation ?- Current symptoms: daily cough , worse at night, wheezing when outside have worsened. He has increased rhinorrhea. Patient reports having shortness of breath and chest pain sometimes but not now. ?- Has a hx of asthma ?- Smoking daily,1/2 p, was 2ppd ?- Had an ED visit ?for sore throat, cough 6 mo ago. ?- Per chart review, CXR from 10/23/17 showed no active cardiopulmonary disease  ? ?Blisters on both feet ?He has blisters on both feet he wants examined today, he has been popping them and cleaning them. He reports they itch and are only on soles of feet, he has been popping them with a needle and cleaning with alcohol. Present  x 2 years.  These " blisters " are intensely itchy he reports and painful ? ?Medications: ?Outpatient Medications Prior to Visit  ?Medication Sig  ? albuterol (PROVENTIL HFA;VENTOLIN HFA) 108 (90 Base) MCG/ACT inhaler Inhale 2 puffs into the lungs every 6 (six) hours as needed for wheezing or shortness of breath.  ? atorvastatin (LIPITOR) 10 MG tablet Take 1 tablet (10 mg total) by mouth at bedtime. (Patient not taking: Reported on 10/31/2021)  ? blood glucose meter kit and supplies Dispense based on patient and insurance preference. Use up to four times daily as directed. (FOR ICD-10 E10.9, E11.9). (Patient not taking: Reported  on 10/31/2021)  ? chlorpheniramine-HYDROcodone (TUSSIONEX PENNKINETIC ER) 10-8 MG/5ML SUER Take 5 mLs by mouth at bedtime as needed for cough. (Patient not taking: Reported on 10/31/2021)  ? clindamycin (CLEOCIN) 300 MG capsule Take 1 capsule (300 mg total) by mouth 3 (three) times daily. (Patient not taking: Reported on 10/31/2021)  ? fluticasone-salmeterol (ADVAIR HFA) 115-21 MCG/ACT inhaler Inhale 2 puffs into the lungs 2 (two) times daily. RINSE MOUTH AFTER USE. USE DAILY. (Patient not taking: Reported on 10/31/2021)  ? ketoconazole (NIZORAL) 2 % shampoo Apply 1 application topically 2 (two) times a week. Lather and wash both feet.  Leave on for around 10 minutes. For foot fungus.Do not use on any open sores. (Patient not taking: Reported on 10/31/2021)  ? metFORMIN (GLUCOPHAGE) 500 MG tablet TAKE 1 TABLET BY MOUTH TWO TIMES DAILY WITH A MEAL  ? methylPREDNISolone (MEDROL DOSEPAK) 4 MG TBPK tablet Take Tapered dose as directed (Patient not taking: Reported on 10/31/2021)  ? methylPREDNISolone (MEDROL DOSEPAK) 4 MG TBPK tablet Take Tapered dose as directed  ? SUMAtriptan (IMITREX) 50 MG tablet Take 1 tablet (50 mg total) by mouth every 2 (two) hours as needed for migraine. May repeat in 2 hours if headache persists or recurs. (Patient not taking: Reported on 10/31/2021)  ? ?No facility-administered medications prior to visit.  ? ? ?Review of Systems  ?Constitutional:  Positive for activity change.  ?HENT: Negative.    ?Respiratory: Negative.    ?Cardiovascular: Negative.   ?Gastrointestinal: Negative.   ?  ?Patient  denies any fever, body aches,chills, rash, chest pain, shortness of breath, nausea, vomiting, or diarrhea.  ?Denies dizziness, lightheadedness, pre syncopal or syncopal episodes.  ?  ?See HPI ? ?  Objective  ?  ?BP 135/90 (BP Location: Right Arm, Patient Position: Sitting, Cuff Size: Large)   Pulse 87   Temp 98.1 ?F (36.7 ?C) (Oral)   Ht _0  (1.676 m)   Wt 239 lb (108.4 kg)   SpO2 97%   BMI 38.58  kg/m?  ? ? ?Physical Exam ?Vitals and nursing note reviewed.  ?Constitutional:   ?   General: He is in acute distress.  ?   Appearance: Normal appearance. He is obese.  ?HENT:  ?   Head: Normocephalic and atraumatic.  ?Cardiovascular:  ?   Rate and Rhythm: Tachycardia present.  ?   Pulses: Normal pulses.  ?Pulmonary:  ?   Effort: Pulmonary effort is normal.  ?   Breath sounds: Wheezing present.  ?Abdominal:  ?   General: Abdomen is flat. Bowel sounds are normal.  ?   Palpations: Abdomen is soft.  ?Skin: ?   Comments: Erythema and white, macerated skin between the toes  ?with  tender interdigital fissures .  ?Diffuse, hyperkeratotic eruption involving the soles and medial and lateral surfaces of the feet, resembling a "moccasin" distribution    ?Erythematous  and hyperpigmented papules and crusts on the foot  ?Friction blisters present as vesicles or bullae containing clear fluid.   ?Neurological:  ?   General: No focal deficit present.  ?   Mental Status: He is alert and oriented to person, place, and time.  ?Psychiatric:     ?   Behavior: Behavior normal.     ?   Thought Content: Thought content normal.     ?   Judgment: Judgment normal.  ?  ? ?No results found for any visits on 10/31/21. ? Assessment & Plan  ?  ? ? ?1. Diabetes mellitus without complication (HCC) ?- POCT glycosylated hemoglobin (Hb A1C) 5.6 WNL, could be complete remission? But his current Hgb level is 9.9. Needs reticulocyte count. ?- metFORMIN (GLUCOPHAGE) 500 MG tablet; TAKE 1 TABLET BY MOUTH TWO TIMES DAILY WITH A MEAL  Dispense: 180 tablet; Refill: 1 ?Will hold Metformin for this time. Reassess at the next visit. ?- CBC with Differential/Platelet ?- Lipid panel ?- Needs eye exam, urinary microglobulin/creatinine ratio if patient agrees. ?- BMI 38.58. Weight loss and dietary changes advised.  ? ?2. Tinea pedis/Friction blisters ?- continue daily foot care with iodine or chlorhexidine and OTC ketonazole. ?- avoid popping blisters, use padding,  keep area clean ?- terbinafine (LAMISIL) 250 MG tablet; Take 1 tablet (250 mg total) by mouth daily.  Dispense: 14 tablet; Refill: 0 ?- Will reassess and add antibiotic if needed. Might refer to Dermatology if

## 2021-11-01 ENCOUNTER — Other Ambulatory Visit: Payer: Self-pay | Admitting: Physician Assistant

## 2021-11-01 DIAGNOSIS — D649 Anemia, unspecified: Secondary | ICD-10-CM

## 2021-11-01 LAB — CBC WITH DIFFERENTIAL/PLATELET
Basophils Absolute: 0 10*3/uL (ref 0.0–0.2)
Basos: 0 %
EOS (ABSOLUTE): 0.3 10*3/uL (ref 0.0–0.4)
Eos: 3 %
Hematocrit: 42.2 % (ref 37.5–51.0)
Hemoglobin: 14.3 g/dL (ref 13.0–17.7)
Immature Grans (Abs): 0 10*3/uL (ref 0.0–0.1)
Immature Granulocytes: 0 %
Lymphocytes Absolute: 2.8 10*3/uL (ref 0.7–3.1)
Lymphs: 31 %
MCH: 30.2 pg (ref 26.6–33.0)
MCHC: 33.9 g/dL (ref 31.5–35.7)
MCV: 89 fL (ref 79–97)
Monocytes Absolute: 0.7 10*3/uL (ref 0.1–0.9)
Monocytes: 8 %
Neutrophils Absolute: 5.2 10*3/uL (ref 1.4–7.0)
Neutrophils: 58 %
Platelets: 293 10*3/uL (ref 150–450)
RBC: 4.74 x10E6/uL (ref 4.14–5.80)
RDW: 12.5 % (ref 11.6–15.4)
WBC: 9 10*3/uL (ref 3.4–10.8)

## 2021-11-01 LAB — COMPREHENSIVE METABOLIC PANEL
ALT: 27 IU/L (ref 0–44)
AST: 24 IU/L (ref 0–40)
Albumin/Globulin Ratio: 1.5 (ref 1.2–2.2)
Albumin: 4.5 g/dL (ref 4.0–5.0)
Alkaline Phosphatase: 61 IU/L (ref 44–121)
BUN/Creatinine Ratio: 15 (ref 9–20)
BUN: 15 mg/dL (ref 6–24)
Bilirubin Total: <0.2 mg/dL (ref 0.0–1.2)
CO2: 26 mmol/L (ref 20–29)
Calcium: 9.7 mg/dL (ref 8.7–10.2)
Chloride: 100 mmol/L (ref 96–106)
Creatinine, Ser: 1.01 mg/dL (ref 0.76–1.27)
Globulin, Total: 3.1 g/dL (ref 1.5–4.5)
Glucose: 72 mg/dL (ref 70–99)
Potassium: 4.5 mmol/L (ref 3.5–5.2)
Sodium: 143 mmol/L (ref 134–144)
Total Protein: 7.6 g/dL (ref 6.0–8.5)
eGFR: 95 mL/min/{1.73_m2} (ref >59)

## 2021-11-01 LAB — LIPID PANEL
Chol/HDL Ratio: 4.2 ratio (ref 0.0–5.0)
Cholesterol, Total: 211 mg/dL — ABNORMAL HIGH (ref 100–199)
HDL: 50 mg/dL (ref >39)
LDL Chol Calc (NIH): 142 mg/dL — ABNORMAL HIGH (ref 0–99)
Triglycerides: 108 mg/dL (ref 0–149)
VLDL Cholesterol Cal: 19 mg/dL (ref 5–40)

## 2021-11-01 LAB — TSH: TSH: 1.68 u[IU]/mL (ref 0.450–4.500)

## 2021-11-01 NOTE — Progress Notes (Signed)
Hemoglobin 9.9 Ordered reticulocyte count. ?

## 2021-11-08 NOTE — Progress Notes (Signed)
?  ? ?I,Binnie Droessler Robinson,acting as a Education administrator for [employer], PA-C.,have documented all relevant documentation on the behalf of [name], PA-C,as directed by  [employer], PA-C while in the presence of [employer], PA-C. ? ? ?Established patient visit ? ? ?Patient: Austin Serrano   DOB: December 04, 1975   46 y.o. Male  MRN: [id number] ?Visit Date: 11/11/2021 ? ?Today's healthcare provider: [name], PA-C  ? ?Patient is here for follow up diabetes, elevated BP, lyperlipidemia, chronic cough and foot blisters.   ?Subjective  ?  ? ?Diabetes Mellitus Type II, follow-up ? ?Lab Results  ?Component Value Date  ? HGBA1C 5.6 08/07/2020  ? HGBA1C 5.8 (A) 04/26/2020  ? ?Last seen for diabetes 10 days ago.  ?Management since then include: Pt patient / advised to not take Metformin. ? ?Current insulin regiment: none  ?Most Recent Eye Exam: due  ? ?--------------------------------------------------------------------------------------------------- ?Hypertension, follow-up ? ?BP Readings from Last 3 Encounters:  ?11/11/21 134/80  ?10/31/21 (!) 146/100  ?12/22/20 (!) 140/96  ? Wt Readings from Last 3 Encounters:  ?11/11/21 243 lb 9.6 oz (110.5 kg)  ?10/31/21 239 lb (108.4 kg)  ?12/22/20 238 lb (108 kg)  ?  ? ?He was last seen for hypertension 10 days ago.  ?BP at that visit was 135/90. ?Management since that visit includes: Amlodipine 66m daily. ?He reports  poor  compliance with treatment:   Pt is not taking medication.  ?Outside blood pressures are 137/97 only taken once at home  ? ?He is a smoker / 1/2 pack daily  ? ?Use of agents associated with hypertension: none.  ? ?--------------------------------------------------------------------------------------------------- ?Lipid/Cholesterol, follow-up ? ?Last Lipid Panel: ?Lab Results  ?Component Value Date  ? CHOL 211 (H) 10/31/2021  ? LDLCALC 142 (H) 10/31/2021  ? HDL 50 10/31/2021  ? TRIG 108 10/31/2021  ? ? ?He was last seen for this 10 days ago.  ?Management since that  visit includes: statin recommended. ? ? ? ? ?Last metabolic panel ?Lab Results  ?Component Value Date  ? GLUCOSE 72 10/31/2021  ? NA 143 10/31/2021  ? K 4.5 10/31/2021  ? BUN 15 10/31/2021  ? CREATININE 1.01 10/31/2021  ? EGFR 95 10/31/2021  ? GFRNONAA 80 04/26/2020  ? CALCIUM 9.7 10/31/2021  ? AST 24 10/31/2021  ? ALT 27 10/31/2021  ? ?The 10-year ASCVD risk score (Arnett DK, et al., 2019) is: 20.7% ? ?---------------------------------------------------------------------------------------------------  ? ?Medications: ?Outpatient Medications Prior to Visit  ?Medication Sig  ? albuterol (PROVENTIL HFA;VENTOLIN HFA) 108 (90 Base) MCG/ACT inhaler Inhale 2 puffs into the lungs every 6 (six) hours as needed for wheezing or shortness of breath.  ? blood glucose meter kit and supplies Dispense based on patient and insurance preference. Use up to four times daily as directed. (FOR ICD-10 E10.9, E11.9).  ? ketoconazole (NIZORAL) 2 % shampoo Apply 1 application topically 2 (two) times a week. Lather and wash both feet.  Leave on for around 10 minutes. For foot fungus.Do not use on any open sores.  ? terbinafine (LAMISIL) 250 MG tablet Take 1 tablet (250 mg total) by mouth daily.  ? amLODipine (NORVASC) 5 MG tablet Take 1 tablet (5 mg total) by mouth daily. (Patient not taking: Reported on 11/11/2021)  ? clindamycin (CLEOCIN) 300 MG capsule Take 1 capsule (300 mg total) by mouth 3 (three) times daily.  ? ?No facility-administered medications prior to visit.  ? ? ?Review of Systems  ?HENT:  Positive for rhinorrhea.   ?Respiratory:  Negative for shortness of breath.   ?  Cardiovascular:  Negative for chest pain.  ?Genitourinary: Negative.   ?Skin:  Positive for rash.  ?Neurological:  Positive for headaches.  ?Psychiatric/Behavioral: Negative.    ? ? ?  Objective  ?  ?BP 134/80 (BP Location: Right Arm, Patient Position: Sitting, Cuff Size: Large)   Pulse 83   Temp 97.9 ?F (36.6 ?C) (Oral)   Resp 16   Wt 243 lb 9.6 oz (110.5 kg)    SpO2 98%   BMI 39.32 kg/m?  ? ? ?Physical Exam ?Vitals and nursing note reviewed.  ?Constitutional:   ?   Appearance: Normal appearance. He is obese.  ?Cardiovascular:  ?   Rate and Rhythm: Normal rate and regular rhythm.  ?   Pulses: Normal pulses.  ?   Heart sounds: Normal heart sounds.  ?Pulmonary:  ?   Effort: Pulmonary effort is normal.  ?   Breath sounds: Normal breath sounds.  ?Musculoskeletal:     ?   General: Normal range of motion.  ?   Right lower leg: No edema.  ?   Left lower leg: No edema.  ?Skin: ?   Findings: Rash present.  ?Neurological:  ?   General: No focal deficit present.  ?   Mental Status: He is alert and oriented to person, place, and time.  ?Psychiatric:     ?   Behavior: Behavior normal.     ?   Thought Content: Thought content normal.     ?   Judgment: Judgment normal.  ?  ? ? ? Assessment & Plan  ?  ? ?1. Hyperlipidemia, unspecified hyperlipidemia type ?Lipid levels from 10/31/21 were elevated ?Patient decided to proceed with statin medication: ?Atorvastatin 10 mg daily ?Encouraged lifestyle style modification diet and exercise ?Follow-up in 1 months ? ?2. Diabetes mellitus without complication ([place]) ?POCT A1c today 5.6 ?Not taking medication  ?Encourage lifestyle modifications ?Will monitor ? ?3. Hypertension, unspecified type ?BP today 134/80 ?Patient prefers not to continue with blood pressure medication at this point ?Encourage lifestyle modifications via low salt diet and daily exercise ? ?4. Wheezing/smoking cessation ?Use albuterol inhaler as a rescue inhaler ?Use Advair as a maintenance inhaler ?Patient did an excellent job in reducing his daily cigarette intake. ?Encouraged to continue ?Follow-up in 1 months ? ?5. Tinea pedis of both feet ?Improving. ?Continue antifungal medication iodine soaks ?Follow-up in 1 months ? ?6. Morbid obesity  ?BMI 39.32  ?Lifestyle modification: patient promised to modify his diet but it has been difficult for him to exercise daily due to his  current work schedule. ? ?Follow-up as scheduled ?   ?The patient was advised to call back or seek an in-person evaluation if the symptoms worsen or if the condition fails to improve as anticipated. ? ?I discussed the assessment and treatment plan with the patient. The patient was provided an opportunity to ask questions and all were answered. The patient agreed with the plan and demonstrated an understanding of the instructions. ? ?The entirety of the information documented in the History of Present Illness, Review of Systems and Physical Exam were personally obtained by me. Portions of this information were initially documented by the CMA and reviewed by me for thoroughness and accuracy.   ? ? ?[name], PA-C  ?[place] ?[phone] (phone) ?[phone] (fax) ? ?[facility] ?

## 2021-11-11 ENCOUNTER — Encounter: Payer: Self-pay | Admitting: Physician Assistant

## 2021-11-11 ENCOUNTER — Ambulatory Visit: Payer: BC Managed Care – PPO | Admitting: Physician Assistant

## 2021-11-11 VITALS — BP 134/80 | HR 83 | Temp 97.9°F | Resp 16 | Wt 243.6 lb

## 2021-11-11 DIAGNOSIS — B353 Tinea pedis: Secondary | ICD-10-CM

## 2021-11-11 DIAGNOSIS — E119 Type 2 diabetes mellitus without complications: Secondary | ICD-10-CM | POA: Diagnosis not present

## 2021-11-11 DIAGNOSIS — I10 Essential (primary) hypertension: Secondary | ICD-10-CM | POA: Diagnosis not present

## 2021-11-11 DIAGNOSIS — E785 Hyperlipidemia, unspecified: Secondary | ICD-10-CM

## 2021-11-11 DIAGNOSIS — R062 Wheezing: Secondary | ICD-10-CM

## 2021-11-11 LAB — POCT GLYCOSYLATED HEMOGLOBIN (HGB A1C): Hemoglobin A1C: 5.6 % (ref 4.0–5.6)

## 2021-11-11 MED ORDER — ATORVASTATIN CALCIUM 10 MG PO TABS: 10.0000 mg | ORAL_TABLET | Freq: Every day | ORAL | 0 refills | Status: DC

## 2021-11-11 MED ORDER — FLUTICASONE-SALMETEROL 100-50 MCG/ACT IN AEPB: 1.0000 | INHALATION_SPRAY | Freq: Two times a day (BID) | RESPIRATORY_TRACT | 3 refills | Status: AC

## 2021-11-11 MED ORDER — TERBINAFINE HCL 250 MG PO TABS: 250.0000 mg | ORAL_TABLET | Freq: Every day | ORAL | 0 refills | Status: AC

## 2021-11-11 MED ORDER — ALBUTEROL SULFATE HFA 108 (90 BASE) MCG/ACT IN AERS: 2.0000 | INHALATION_SPRAY | Freq: Four times a day (QID) | RESPIRATORY_TRACT | 0 refills | Status: DC | PRN

## 2021-11-26 LAB — SPECIMEN STATUS REPORT

## 2021-11-26 LAB — RETICULOCYTES

## 2021-11-27 ENCOUNTER — Other Ambulatory Visit: Payer: Self-pay | Admitting: Physician Assistant

## 2021-11-27 DIAGNOSIS — R030 Elevated blood-pressure reading, without diagnosis of hypertension: Secondary | ICD-10-CM

## 2021-12-03 ENCOUNTER — Other Ambulatory Visit: Payer: Self-pay | Admitting: Physician Assistant

## 2021-12-03 DIAGNOSIS — R062 Wheezing: Secondary | ICD-10-CM

## 2021-12-09 ENCOUNTER — Other Ambulatory Visit: Payer: Self-pay | Admitting: Physician Assistant

## 2021-12-09 DIAGNOSIS — E785 Hyperlipidemia, unspecified: Secondary | ICD-10-CM

## 2021-12-11 DIAGNOSIS — E119 Type 2 diabetes mellitus without complications: Secondary | ICD-10-CM | POA: Insufficient documentation

## 2021-12-11 DIAGNOSIS — I152 Hypertension secondary to endocrine disorders: Secondary | ICD-10-CM | POA: Insufficient documentation

## 2021-12-11 NOTE — Assessment & Plan Note (Signed)
?  BP today 134/80 ?Patient prefers not to continue with blood pressure medication at this point ?Encourage lifestyle modifications via low salt diet and daily exercise ?? ?

## 2021-12-11 NOTE — Assessment & Plan Note (Signed)
?  Lipid levels from 10/31/21 were elevated ?Patient decided to proceed with statin medication: ?Atorvastatin 10 mg daily ?Encouraged lifestyle style modification diet and exercise ?Follow-up in 1 months ?Marland Kitchen ?

## 2021-12-11 NOTE — Assessment & Plan Note (Signed)
?  POCT A1c today 5.6 ?Not taking medication  ?Encourage lifestyle modifications ?Will monitor ? ?

## 2021-12-11 NOTE — Assessment & Plan Note (Signed)
?  BMI 39.32  ?Lifestyle modification: patient promised to modify his diet but it has been difficult for him to exercise daily due to his current work schedule. ?

## 2021-12-11 NOTE — Assessment & Plan Note (Signed)
?  Use albuterol inhaler as a rescue inhaler ?Use Advair as a maintenance inhaler ?Patient did an excellent job in reducing his daily cigarette intake. ?Encouraged to continue ?Follow-up in 1 months ?? ? ?

## 2021-12-11 NOTE — Assessment & Plan Note (Signed)
?  Improving. ?Continue antifungal medication iodine soaks ?Follow-up in 1 months ?? ?

## 2021-12-12 ENCOUNTER — Ambulatory Visit (INDEPENDENT_AMBULATORY_CARE_PROVIDER_SITE_OTHER): Payer: BC Managed Care – PPO | Admitting: Physician Assistant

## 2021-12-12 ENCOUNTER — Encounter: Payer: Self-pay | Admitting: Physician Assistant

## 2021-12-12 VITALS — BP 109/70 | HR 81 | Temp 97.9°F | Resp 16 | Wt 239.7 lb

## 2021-12-12 DIAGNOSIS — E1169 Type 2 diabetes mellitus with other specified complication: Secondary | ICD-10-CM

## 2021-12-12 DIAGNOSIS — J45909 Unspecified asthma, uncomplicated: Secondary | ICD-10-CM

## 2021-12-12 DIAGNOSIS — B353 Tinea pedis: Secondary | ICD-10-CM

## 2021-12-12 DIAGNOSIS — E1159 Type 2 diabetes mellitus with other circulatory complications: Secondary | ICD-10-CM

## 2021-12-12 DIAGNOSIS — E119 Type 2 diabetes mellitus without complications: Secondary | ICD-10-CM

## 2021-12-12 DIAGNOSIS — E785 Hyperlipidemia, unspecified: Secondary | ICD-10-CM

## 2021-12-12 DIAGNOSIS — R062 Wheezing: Secondary | ICD-10-CM

## 2021-12-12 DIAGNOSIS — I152 Hypertension secondary to endocrine disorders: Secondary | ICD-10-CM

## 2021-12-12 DIAGNOSIS — G44009 Cluster headache syndrome, unspecified, not intractable: Secondary | ICD-10-CM

## 2021-12-12 NOTE — Progress Notes (Signed)
?  ?I,Austin Serrano,acting as a Education administrator for [employer], PA-C.,have documented all relevant documentation on the behalf of Austin Speak, PA-C,as directed by  [employer], PA-C while in the presence of [employer], PA-C. ? ? ? ?Established patient visit ? ? ?Patient: Austin Serrano   DOB: [birth year]   46 y.o. Male  MRN: [id number] ?Visit Date: 12/12/2021 ? ?Today's healthcare provider: [name], PA-C  ? ?Chief Complaint  ?Patient presents with  ? Hypertension  ? Hyperlipidemia  ? Diabetes  ? ?Subjective  ?  ?HPI  ?Diabetes Mellitus Type II, follow-up ? ?Lab Results  ?Component Value Date  ? HGBA1C 5.6 11/11/2021  ? HGBA1C 5.6 08/07/2020  ? HGBA1C 5.8 (A) 04/26/2020  ? ?Last seen for diabetes 1 months ago.  ?Management since then includes Not taking medication  ?Encourage lifestyle modifications ? ?Home blood sugar records: none  ? ?Episodes of hypoglycemia? No ?  ?Current insulin regiment: Most Recent Eye Exam: pt reminded to make appt. ? ?--------------------------------------------------------------------------------------------------- ?Hypertension, follow-up ? ?BP Readings from Last 3 Encounters:  ?12/12/21 109/70  ?11/11/21 134/80  ?10/31/21 (!) 146/100  ? Wt Readings from Last 3 Encounters:  ?12/12/21 239 lb 11.2 oz (108.7 kg)  ?11/11/21 243 lb 9.6 oz (110.5 kg)  ?10/31/21 239 lb (108.4 kg)  ?  ? ?He was last seen for hypertension 1 months ago.  ?BP at that visit was 134 80. ?Management since that visit includes: prefers not to continue with blood pressure medication at this point ?Encourage lifestyle modifications via low salt diet and daily exercise . ?He reports fair compliance with treatment. ?He is not exercising. ?He is not adherent to low salt diet.   ?Outside blood pressures are  ?He does smoke. ? ?Use of agents associated with hypertension: none.  ? ?--------------------------------------------------------------------------------------------------- ?Lipid/Cholesterol, follow-up ? ?Last  Lipid Panel: ?Lab Results  ?Component Value Date  ? CHOL 211 (H) 10/31/2021  ? LDLCALC 142 (H) 10/31/2021  ? HDL 50 10/31/2021  ? TRIG 108 10/31/2021  ? ? ?He was last seen for this 1 months ago.  ?Management since that visit includes start Atorvastatin 10 mg daily. ?He reports fair compliance with treatment. ?He is not having side effects. ? ?Symptoms: ?No appetite changes No foot ulcerations  ?No chest pain No chest pressure/discomfort  ?No dyspnea No orthopnea  ?No fatigue No lower extremity edema  ?No palpitations No paroxysmal nocturnal dyspnea  ?No nausea No numbness or tingling of extremity  ?No polydipsia No polyuria  ?No speech difficulty No syncope  ? ?He is following a Regular diet. ?Current exercise: none ? ?Last metabolic panel ?Lab Results  ?Component Value Date  ? GLUCOSE 72 10/31/2021  ? NA 143 10/31/2021  ? K 4.5 10/31/2021  ? BUN 15 10/31/2021  ? CREATININE 1.01 10/31/2021  ? EGFR 95 10/31/2021  ? GFRNONAA 80 04/26/2020  ? CALCIUM 9.7 10/31/2021  ? AST 24 10/31/2021  ? ALT 27 10/31/2021  ? ?The 10-year ASCVD risk score (Arnett DK, et al., 2019) is: 14.4% ? ?---------------------------------------------------------------------------------------------------  ?Patient states he  has not picked up Rx for headaches. ?Reports feet are much better.   ? ?Medications: ?Outpatient Medications Prior to Visit  ?Medication Sig  ? albuterol (VENTOLIN HFA) 108 (90 Base) MCG/ACT inhaler INHALE 2 PUFFS INTO THE LUNGS EVERY 6 HOURS AS NEEDED FOR WHEEZING OR SHORTNESS OF BREATH  ? atorvastatin (LIPITOR) 10 MG tablet TAKE 1 TABLET(10 MG) BY MOUTH DAILY  ? blood glucose meter kit and supplies Dispense based on patient  and insurance preference. Use up to four times daily as directed. (FOR ICD-10 E10.9, E11.9).  ? terbinafine (LAMISIL) 250 MG tablet Take 1 tablet (250 mg total) by mouth daily.  ? amLODipine (NORVASC) 5 MG tablet TAKE 1 TABLET(5 MG) BY MOUTH DAILY (Patient not taking: Reported on 12/12/2021)  ?  fluticasone-salmeterol (ADVAIR) 100-50 MCG/ACT AEPB Inhale 1 puff into the lungs 2 (two) times daily. (Patient not taking: Reported on 12/12/2021)  ? ketoconazole (NIZORAL) 2 % shampoo Apply 1 application topically 2 (two) times a week. Lather and wash both feet.  Leave on for around 10 minutes. For foot fungus.Do not use on any open sores. (Patient not taking: Reported on 12/12/2021)  ? ?No facility-administered medications prior to visit.  ? ? ?Review of Systems  ?HENT:  Positive for congestion, postnasal drip, rhinorrhea, sinus pressure and sneezing.   ?Respiratory:  Positive for wheezing. Negative for cough, chest tightness and shortness of breath.   ?Gastrointestinal: Negative.  Negative for abdominal pain.  ?Skin: Negative.   ?Neurological:  Positive for headaches. Negative for dizziness, weakness and numbness.  ? ? ?  Objective  ?  ? ? ?Physical Exam ?Vitals and nursing note reviewed.  ?Constitutional:   ?   Appearance: Normal appearance. He is obese.  ?HENT:  ?   Head: Normocephalic and atraumatic.  ?   Right Ear: Ear canal and external ear normal.  ?   Left Ear: Ear canal and external ear normal.  ?   Ears:  ?   Comments: Fluids behind TMs ?   Nose: Congestion and rhinorrhea present.  ?Cardiovascular:  ?   Rate and Rhythm: Normal rate and regular rhythm.  ?   Pulses: Normal pulses.  ?   Heart sounds: Normal heart sounds.  ?Pulmonary:  ?   Breath sounds: Wheezing present.  ?Abdominal:  ?   General: Abdomen is flat. Bowel sounds are normal.  ?   Palpations: Abdomen is soft.  ?Neurological:  ?   Mental Status: He is alert and oriented to person, place, and time.  ?Psychiatric:     ?   Behavior: Behavior normal.     ?   Thought Content: Thought content normal.     ?   Judgment: Judgment normal.  ?  ? ?No results found for any visits on 12/12/21. ? Assessment & Plan  ?  ? ?1. Hyperlipidemia, unspecified hyperlipidemia type ?Continue with statin medication: ?Continue Atorvastatin 10 mg daily ?Encouraged lifestyle  style modification diet and exercise ?  ?2. Diabetes mellitus without complication ([place]) ?POCT A1c today 5.6, partial remission ?Not taking medication  ?Encourage lifestyle modifications ?Will monitor ?  ?3. Hypertension, unspecified type ?BP today 109/70, not taking BP medication ?Encourage lifestyle modifications via low salt diet and daily exercise ?Eye exam is scheduled. ?  ?4. Wheezing/smoking cessation/allergic rhinitis/ETD ?Chronic/poorly-controlled ?Use albuterol inhaler as a rescue inhaler ?Use Advair as a maintenance inhaler twice daily! He has been using it only once daily, was inconsistent ?Continue OTC antihistamines, Flonase ?Patient did an excellent job in reducing his daily cigarette intake to 3 cigarettes daily ?Congratulated patient ?Encouraged to continue smoking cessation ?Ambulatory referral to Allergy/Asthma ?Might need to do a trial of prednisone and chest Xrays ? ?5. Tinea pedis of both feet ?Resolved ?Continue good foot care , keep feet dry, use antifungal powder or cream and iodine soaks if needed. ? ?  ?6. Morbid obesity  ?BMI 39.32  ?Lifestyle modification: patient promised to modify his diet but it has been difficult  for him to exercise daily due to his current work schedule. ?  ?7. Cluster headache ?Chronic. Uses compresses and OTC pain medication as needed. ?Patient prefers not to take medications. ?Might need to use prednisone for 18 days, verapamil for prevention ?Might consider a referral to Neurology ? ?Follow-up as scheduled ?  ?  ?The patient was advised to call back or seek an in-person evaluation if the symptoms worsen or if the condition fails to improve as anticipated. ?  ?I discussed the assessment and treatment plan with the patient. The patient was provided an opportunity to ask questions and all were answered. The patient agreed with the plan and demonstrated an understanding of the instructions. ?  ?The entirety of the information documented in the History of Present  Illness, Review of Systems and Physical Exam were personally obtained by me. Portions of this information were initially documented by the CMA and reviewed by me for thoroughness and accuracy.   ?  ?  ?Austin Speak, PA-C

## 2021-12-27 ENCOUNTER — Other Ambulatory Visit: Payer: Self-pay | Admitting: Physician Assistant

## 2021-12-27 DIAGNOSIS — R062 Wheezing: Secondary | ICD-10-CM

## 2021-12-27 NOTE — Telephone Encounter (Signed)
Requested Prescriptions  Pending Prescriptions Disp Refills  . albuterol (VENTOLIN HFA) 108 (90 Base) MCG/ACT inhaler [Pharmacy Med Name: ALBUTEROL HFA INH (200 PUFFS) 6.7GM] 6.7 g 0    Sig: INHALE 2 PUFFS INTO THE LUNGS EVERY 6 HOURS AS NEEDED FOR WHEEZING OR SHORTNESS OF BREATH     Pulmonology:  B[facility]on[facility] 2 Passed - 12/27/2021  3:39 AM      Passed - Last BP in normal range    BP Readings from Last 1 Encounters:  12/12/21 109/70         Passed - Last Heart Rate in normal range    Pulse Readings from Last 1 Encounters:  12/12/21 81         Passed - Valid encounter within last 12 months    Recent Outpatient Visits          2 weeks ago Asthma, unspecified asthma severity, unspecified whether complicated, unspecified whether persistent   [facility] [place], [place], PA-C   1 month ago Hyperlipidemia, unspecified hyperlipidemia type   [facility], [place], PA-C   1 month ago Tinea pedis of both feet   [facility], [place], PA-C   1 year ago Diabetes mellitus without complication [facility])   [facility], [place], PA-C   1 year ago Diabetes mellitus without complication [facility])   [facility] [place], [place], [place]      Future Appointments            In 3 weeks Debera Lat, PA-C [facility]

## 2022-01-01 ENCOUNTER — Other Ambulatory Visit: Payer: Self-pay | Admitting: Physician Assistant

## 2022-01-01 DIAGNOSIS — R030 Elevated blood-pressure reading, without diagnosis of hypertension: Secondary | ICD-10-CM

## 2022-01-16 ENCOUNTER — Other Ambulatory Visit: Payer: Self-pay | Admitting: Physician Assistant

## 2022-01-16 DIAGNOSIS — E785 Hyperlipidemia, unspecified: Secondary | ICD-10-CM

## 2022-01-22 NOTE — Progress Notes (Signed)
I,Austin Serrano,acting as a Education administrator for [employer], PA-C.,have documented all relevant documentation on the behalf of Austin Speak, PA-C,as directed by  [employer], PA-C while in the presence of [employer], PA-C.  Established patient visit   Patient: Austin Serrano   DOB: 1975-12-23   46 y.o. Male  MRN: [id number] Visit Date: 01/23/2022  Today's healthcare provider: [name], PA-C   Chief Complaint  Patient presents with   Hyperlipidemia   Subjective     Lipid/Cholesterol, Follow-up  Last lipid panel Other pertinent labs  Lab Results  Component Value Date   CHOL 211 (H) 10/31/2021   HDL 50 10/31/2021   LDLCALC 142 (H) 10/31/2021   TRIG 108 10/31/2021   CHOLHDL 4.2 10/31/2021   Lab Results  Component Value Date   ALT 27 10/31/2021   AST 24 10/31/2021   PLT 293 10/31/2021   TSH 1.680 10/31/2021     He was last seen for this 1 months ago.  Management since that visit includes Atorvastatin 10 mg daily Encouraged lifestyle style modification diet and exercise.  He reports fair compliance with treatment. Sometimes forgets.  He is not having side effects.  Symptoms: No chest pain No chest pressure/discomfort  No dyspnea No lower extremity edema  No numbness or tingling of extremity No orthopnea  No palpitations No paroxysmal nocturnal dyspnea  No speech difficulty No syncope   Current diet: on average, some fast food meals per week Current exercise: none  The 10-year ASCVD risk score (Arnett DK, et al., 2019) is: 19.4%  ---------------------------------------------------------------------------------------------------  Follow up for wheezing  The patient was last seen for this 1 months ago. Changes made at last visit include: Use albuterol inhaler as a rescue inhaler Use Advair as a maintenance inhaler twice daily! He has been using it only once daily, was inconsistent Continue OTC antihistamines, Flonase  He reports good compliance with  treatment. He feels that condition is Improved. He is not having side effects.  Reports not hearing from referrals.   ----------------------------------------------------------------------------------------- Patient reports more headaches on left side lately.   HEADACHE, at night  Headache started this year a year ago but got worse 3 mo ago. Pain is sharp, constant, sore on touch like "you were hit by baseball bat" Cannot brush hair on the left side as it feels tender and swollen Keeps from doing:  sleep, quality of life Location: left side of head Medications tried: ibuprofen, acetaminophen Patient thinks cause of headache might be: has no idea, maybe stress  Head trauma: no Sudden onset: no Previous similar headaches: no Taking blood thinners: no History of cancer: no  Symptoms Nose congestion stuffiness:  yes Nausea vomiting: no Photophobia: sometimes Noise sensitivity: no Double vision or loss of vision: no Fever: no Neck Stiffness: no Trouble walking or speaking: no   Review of Symptoms - see HPI PMH - Smoking status noted.      10/31/2021    1:20 PM 08/07/2020   11:12 AM 04/26/2020   11:07 AM  Depression screen PHQ 2/9  Decreased Interest 0 0 0  Down, Depressed, Hopeless 0 0 0  PHQ - 2 Score 0 0 0  Altered sleeping 3 0 0  Tired, decreased energy 1 0 0  Change in appetite 2 0 2  Feeling bad or failure about yourself  2 0 0  Trouble concentrating 0 2 0  Moving slowly or fidgety/restless 0 0 0  Suicidal thoughts 0 0 0  PHQ-9 Score 8  2 2  Difficult doing work/chores Not difficult at all Not difficult at all Not difficult at all    Medications: Outpatient Medications Prior to Visit  Medication Sig   albuterol (VENTOLIN HFA) 108 (90 Base) MCG/ACT inhaler INHALE 2 PUFFS INTO THE LUNGS EVERY 6 HOURS AS NEEDED FOR WHEEZING OR SHORTNESS OF BREATH   atorvastatin (LIPITOR) 10 MG tablet TAKE 1 TABLET(10 MG) BY MOUTH DAILY   blood glucose meter kit and supplies  Dispense based on patient and insurance preference. Use up to four times daily as directed. (FOR ICD-10 E10.9, E11.9).   fluticasone-salmeterol (ADVAIR) 100-50 MCG/ACT AEPB Inhale 1 puff into the lungs 2 (two) times daily.   amLODipine (NORVASC) 5 MG tablet TAKE 1 TABLET(5 MG) BY MOUTH DAILY (Patient not taking: Reported on 01/23/2022)   ketoconazole (NIZORAL) 2 % shampoo Apply 1 application topically 2 (two) times a week. Lather and wash both feet.  Leave on for around 10 minutes. For foot fungus.Do not use on any open sores. (Patient not taking: Reported on 12/12/2021)   terbinafine (LAMISIL) 250 MG tablet Take 1 tablet (250 mg total) by mouth daily. (Patient not taking: Reported on 01/23/2022)   No facility-administered medications prior to visit.    Review of Systems  Constitutional:  Negative for fatigue.  HENT:  Positive for sneezing and voice change. Negative for congestion.   Respiratory:  Positive for cough and wheezing. Negative for shortness of breath.   Cardiovascular: Negative.   Gastrointestinal:  Negative for abdominal pain, constipation, diarrhea, nausea and vomiting.  Neurological:  Positive for headaches. Negative for syncope and numbness.       Objective    BP 129/79 (BP Location: Right Arm, Patient Position: Sitting, Cuff Size: Large)   Pulse 76   Temp 97.7 F (36.5 C) (Oral)   Resp 16   Wt 239 lb 6.4 oz (108.6 kg)   SpO2 99%   BMI 38.64 kg/m    Physical Exam Vitals reviewed.  Constitutional:      General: He is not in acute distress.    Appearance: Normal appearance. He is not diaphoretic.  HENT:     Head: Normocephalic and atraumatic.     Right Ear: There is impacted cerumen (partially).     Left Ear: Tympanic membrane, ear canal and external ear normal.     Nose: Congestion and rhinorrhea present.     Mouth/Throat:     Pharynx: No oropharyngeal exudate or posterior oropharyngeal erythema.  Eyes:     General: No scleral icterus.       Right eye:  Discharge (watery) present.        Left eye: Discharge (watery) present.    Extraocular Movements: Extraocular movements intact.     Pupils: Pupils are equal, round, and reactive to light.  Cardiovascular:     Rate and Rhythm: Normal rate and regular rhythm.     Pulses: Normal pulses.     Heart sounds: Normal heart sounds. No murmur heard. Pulmonary:     Effort: Pulmonary effort is normal. No respiratory distress.     Breath sounds: Normal breath sounds. No wheezing or rhonchi.  Abdominal:     General: Abdomen is flat. Bowel sounds are normal.     Palpations: Abdomen is soft.  Musculoskeletal:        General: Normal range of motion.     Cervical back: Neck supple.     Right lower leg: No edema.     Left lower leg: No edema.  Lymphadenopathy:     Cervical: No cervical adenopathy.  Skin:    General: Skin is warm and dry.     Findings: No rash.  Neurological:     General: No focal deficit present.     Mental Status: He is alert and oriented to person, place, and time. Mental status is at baseline.     Cranial Nerves: No cranial nerve deficit.     Sensory: No sensory deficit.     Motor: No weakness.     Coordination: Coordination normal.     Gait: Gait normal.     Deep Tendon Reflexes: Reflexes normal.  Psychiatric:        Mood and Affect: Mood normal.        Behavior: Behavior normal.        Thought Content: Thought content normal.        Judgment: Judgment normal.     No results found for any visits on 01/23/22.  Assessment & Plan     1. Hyperlipidemia associated with type 2 diabetes mellitus ([place]) Continue current regimen. Encouraged consistency  2. Diabetes mellitus without complication (HCC)  - Urine microalbumin-creatinine with uACR  3. Hypertension associated with diabetes ([place]) BP today 129/79 Lifestyle modifications advised Continue amlodipine  4. Wheezing Strongly advised to use albuterol PRN and advair BID daily Referral placed to Allergy was placed  on 12/12/21   5. Morbid obesity (HCC) Lifestyle modifications  6. Headache Due to migraine vs allodynia vs hypnic  Check with depression, normal neuro exam, normal eye exam Suggested: Consistent sleep hours and eating habits Cold compresses to area of pain Use acetaminophen, Ibuprofen PRN with omeprazole Riboflavin (vitamin B2): 400 mg/day  Magnesium: 400 mg/day    7. Smoking  Strongly advised smoking cessation  Needs HIV screening Needs Hep C screening? FU in 2 mo    The patient was advised to call back or seek an in-person evaluation if the symptoms worsen or if the condition fails to improve as anticipated.  I discussed the assessment and treatment plan with the patient. The patient was provided an opportunity to ask questions and all were answered. The patient agreed with the plan and demonstrated an understanding of the instructions.  The entirety of the information documented in the History of Present Illness, Review of Systems and Physical Exam were personally obtained by me. Portions of this information were initially documented by the CMA and reviewed by me for thoroughness and accuracy.  Portions of this note were created using dictation software and may contain typographical errors.        Total encounter time more than 30 minutes  Greater than 50% was spent in counseling and coordination of care with the patient   Elberta Leatherwood  [facility] [phone] (phone) [phone] (fax)  [place]

## 2022-01-23 ENCOUNTER — Encounter: Payer: Self-pay | Admitting: Physician Assistant

## 2022-01-23 ENCOUNTER — Ambulatory Visit (INDEPENDENT_AMBULATORY_CARE_PROVIDER_SITE_OTHER): Payer: BC Managed Care – PPO | Admitting: Physician Assistant

## 2022-01-23 VITALS — BP 129/79 | HR 76 | Temp 97.7°F | Resp 16 | Wt 239.4 lb

## 2022-01-23 DIAGNOSIS — E1169 Type 2 diabetes mellitus with other specified complication: Secondary | ICD-10-CM | POA: Diagnosis not present

## 2022-01-23 DIAGNOSIS — E119 Type 2 diabetes mellitus without complications: Secondary | ICD-10-CM

## 2022-01-23 DIAGNOSIS — G44009 Cluster headache syndrome, unspecified, not intractable: Secondary | ICD-10-CM

## 2022-01-23 DIAGNOSIS — E1159 Type 2 diabetes mellitus with other circulatory complications: Secondary | ICD-10-CM

## 2022-01-23 DIAGNOSIS — E785 Hyperlipidemia, unspecified: Secondary | ICD-10-CM

## 2022-01-23 DIAGNOSIS — R062 Wheezing: Secondary | ICD-10-CM

## 2022-01-23 DIAGNOSIS — I152 Hypertension secondary to endocrine disorders: Secondary | ICD-10-CM

## 2022-01-23 MED ORDER — IBUPROFEN 800 MG PO TABS: 800.0000 mg | ORAL_TABLET | Freq: Three times a day (TID) | ORAL | 0 refills | Status: AC | PRN

## 2022-01-23 MED ORDER — OMEPRAZOLE 20 MG PO CPDR: 20.0000 mg | DELAYED_RELEASE_CAPSULE | Freq: Every day | ORAL | 3 refills | Status: AC

## 2022-01-24 LAB — MICROALBUMIN / CREATININE URINE RATIO
Creatinine, Urine: 285 mg/dL
Microalb/Creat Ratio: 3 mg/g creat (ref 0–29)
Microalbumin, Urine: 8.6 ug/mL

## 2022-01-24 NOTE — Progress Notes (Signed)
Hello Austin Serrano ,   Your urine microglobulin-creatinine results are within normal limits.  No changes need to be made to medications, and no further tests need to be ordered.  Any questions please reach out to the office or message me on MyChart!  Best,  Debera Lat, PA-C

## 2022-02-20 ENCOUNTER — Other Ambulatory Visit: Payer: Self-pay | Admitting: Physician Assistant

## 2022-02-20 DIAGNOSIS — E785 Hyperlipidemia, unspecified: Secondary | ICD-10-CM

## 2022-04-03 NOTE — Progress Notes (Deleted)
     I,Jana Deshon Hsiao,acting as a Education administrator for [employer], PA-C.,have documented all relevant documentation on the behalf of [name], PA-C,as directed by  [employer], PA-C while in the presence of [employer], PA-C.   Established patient visit   Patient: Austin Serrano   DOB: 10-Mar-1976   46 y.o. Male  MRN: [id number] Visit Date: 04/04/2022  Today's healthcare provider: [name], PA-C   No chief complaint on file.  Subjective      Medications: Outpatient Medications Prior to Visit  Medication Sig   albuterol (VENTOLIN HFA) 108 (90 Base) MCG/ACT inhaler INHALE 2 PUFFS INTO THE LUNGS EVERY 6 HOURS AS NEEDED FOR WHEEZING OR SHORTNESS OF BREATH   amLODipine (NORVASC) 5 MG tablet TAKE 1 TABLET(5 MG) BY MOUTH DAILY (Patient not taking: Reported on 01/23/2022)   atorvastatin (LIPITOR) 10 MG tablet TAKE 1 TABLET(10 MG) BY MOUTH DAILY   blood glucose meter kit and supplies Dispense based on patient and insurance preference. Use up to four times daily as directed. (FOR ICD-10 E10.9, E11.9).   fluticasone-salmeterol (ADVAIR) 100-50 MCG/ACT AEPB Inhale 1 puff into the lungs 2 (two) times daily.   ibuprofen (ADVIL) 800 MG tablet Take 1 tablet (800 mg total) by mouth every 8 (eight) hours as needed.   ketoconazole (NIZORAL) 2 % shampoo Apply 1 application topically 2 (two) times a week. Lather and wash both feet.  Leave on for around 10 minutes. For foot fungus.Do not use on any open sores. (Patient not taking: Reported on 12/12/2021)   omeprazole (PRILOSEC) 20 MG capsule Take 1 capsule (20 mg total) by mouth daily.   terbinafine (LAMISIL) 250 MG tablet Take 1 tablet (250 mg total) by mouth daily. (Patient not taking: Reported on 01/23/2022)   No facility-administered medications prior to visit.    Review of Systems  {Labs  Heme  Chem  Endocrine  Serology  Results Review (optional):23779}   Objective    There were no vitals taken for this visit. {Show previous vital signs  (optional):23777}  Physical Exam  ***  No results found for any visits on 04/04/22.  Assessment & Plan     ***  No follow-ups on file.      {provider attestation***:1}   [name], Hershal Coria  [facility] [phone] (phone) [phone] (fax)  [place]

## 2022-04-04 ENCOUNTER — Ambulatory Visit: Payer: BC Managed Care – PPO | Admitting: Physician Assistant

## 2022-04-04 DIAGNOSIS — R062 Wheezing: Secondary | ICD-10-CM

## 2022-04-04 DIAGNOSIS — E119 Type 2 diabetes mellitus without complications: Secondary | ICD-10-CM

## 2022-04-04 DIAGNOSIS — F17200 Nicotine dependence, unspecified, uncomplicated: Secondary | ICD-10-CM

## 2022-04-04 DIAGNOSIS — J45909 Unspecified asthma, uncomplicated: Secondary | ICD-10-CM

## 2022-04-04 DIAGNOSIS — E1169 Type 2 diabetes mellitus with other specified complication: Secondary | ICD-10-CM

## 2022-04-04 DIAGNOSIS — E1159 Type 2 diabetes mellitus with other circulatory complications: Secondary | ICD-10-CM

## 2022-04-09 ENCOUNTER — Ambulatory Visit (INDEPENDENT_AMBULATORY_CARE_PROVIDER_SITE_OTHER): Payer: BC Managed Care – PPO | Admitting: Physician Assistant

## 2022-04-09 ENCOUNTER — Encounter: Payer: Self-pay | Admitting: Physician Assistant

## 2022-04-09 VITALS — BP 123/69 | HR 84 | Temp 97.8°F | Resp 16 | Ht 66.0 in | Wt 233.0 lb

## 2022-04-09 DIAGNOSIS — E1159 Type 2 diabetes mellitus with other circulatory complications: Secondary | ICD-10-CM

## 2022-04-09 DIAGNOSIS — R0683 Snoring: Secondary | ICD-10-CM

## 2022-04-09 DIAGNOSIS — E119 Type 2 diabetes mellitus without complications: Secondary | ICD-10-CM

## 2022-04-09 DIAGNOSIS — R062 Wheezing: Secondary | ICD-10-CM

## 2022-04-09 DIAGNOSIS — E1169 Type 2 diabetes mellitus with other specified complication: Secondary | ICD-10-CM | POA: Diagnosis not present

## 2022-04-09 DIAGNOSIS — E785 Hyperlipidemia, unspecified: Secondary | ICD-10-CM

## 2022-04-09 DIAGNOSIS — F17209 Nicotine dependence, unspecified, with unspecified nicotine-induced disorders: Secondary | ICD-10-CM

## 2022-04-09 DIAGNOSIS — I152 Hypertension secondary to endocrine disorders: Secondary | ICD-10-CM

## 2022-04-09 DIAGNOSIS — G4489 Other headache syndrome: Secondary | ICD-10-CM

## 2022-04-09 MED ORDER — IBUPROFEN 600 MG PO TABS: 600.0000 mg | ORAL_TABLET | Freq: Three times a day (TID) | ORAL | 0 refills | Status: AC | PRN

## 2022-04-09 NOTE — Progress Notes (Signed)
I,Sulibeya S Dimas,acting as a Education administrator for [employer], PA-C.,have documented all relevant documentation on the behalf of Austin Speak, PA-C,as directed by  [employer], PA-C while in the presence of [employer], PA-C.   Established patient visit   Patient: Austin Serrano   DOB: [birth year]   46 y.o. Male  MRN: [id number] Visit Date: 04/09/2022  Today's healthcare provider: [name], PA-C   Chief Complaint  Patient presents with   Obesity   Asthma   Subjective    HPI  Follow up for weight  The patient was last seen for this 2 months ago. Changes made at last visit include work on healthy lifestyle changes.  He reports excellent compliance with treatment. Patient reports making healthy diet changes. Patient does not participate in regular exercise due to 14 hour work day.   Wt Readings from Last 3 Encounters:  04/09/22 233 lb (105.7 kg)  01/23/22 239 lb 6.4 oz (108.6 kg)  12/12/21 239 lb 11.2 oz (108.7 kg)    -----------------------------------------------------------------------------------------  Follow up for asthma  The patient was last seen for this 2 months ago. Changes made at last visit include use albuterol prn and Advair BID.  He reports good compliance with treatment. Patient reports using albuterol once a week.  He feels that condition is Improved. He is not having side effects.  Reports that his wife notices snoring and apneic episodes at night Doing daily exercise/walking/ Lost 6 pounds since his last visit States that has been busy and was not able to schedule an appt with Allergy Requested Rx Ibuprofen for occ. headaches ----------------------------------------------------------------------------------------  Medications: Outpatient Medications Prior to Visit  Medication Sig   albuterol (VENTOLIN HFA) 108 (90 Base) MCG/ACT inhaler INHALE 2 PUFFS INTO THE LUNGS EVERY 6 HOURS AS NEEDED FOR WHEEZING OR SHORTNESS OF BREATH    atorvastatin (LIPITOR) 10 MG tablet TAKE 1 TABLET(10 MG) BY MOUTH DAILY   blood glucose meter kit and supplies Dispense based on patient and insurance preference. Use up to four times daily as directed. (FOR ICD-10 E10.9, E11.9).   fluticasone-salmeterol (ADVAIR) 100-50 MCG/ACT AEPB Inhale 1 puff into the lungs 2 (two) times daily.   ibuprofen (ADVIL) 800 MG tablet Take 1 tablet (800 mg total) by mouth every 8 (eight) hours as needed.   amLODipine (NORVASC) 5 MG tablet TAKE 1 TABLET(5 MG) BY MOUTH DAILY   omeprazole (PRILOSEC) 20 MG capsule Take 1 capsule (20 mg total) by mouth daily. (Patient not taking: Reported on 04/09/2022)   terbinafine (LAMISIL) 250 MG tablet Take 1 tablet (250 mg total) by mouth daily. (Patient not taking: Reported on 01/23/2022)   [DISCONTINUED] ketoconazole (NIZORAL) 2 % shampoo Apply 1 application topically 2 (two) times a week. Lather and wash both feet.  Leave on for around 10 minutes. For foot fungus.Do not use on any open sores. (Patient not taking: Reported on 12/12/2021)   No facility-administered medications prior to visit.    Review of Systems  Constitutional:  Positive for fatigue. Negative for chills.  HENT:  Positive for congestion and sneezing. Negative for rhinorrhea, sinus pressure and sinus pain.   Respiratory:  Positive for cough, shortness of breath and wheezing.   Cardiovascular:  Negative for chest pain and palpitations.       Objective    BP 123/69 (BP Location: Right Arm, Patient Position: Sitting, Cuff Size: Large)   Pulse 84   Temp 97.8 F (36.6 C) (Oral)   Resp 16   Ht '5\' 6"'  (  1.676 m)   Wt 233 lb (105.7 kg)   SpO2 97%   BMI 37.61 kg/m  BP Readings from Last 3 Encounters:  04/09/22 123/69  01/23/22 129/79  12/12/21 109/70   Wt Readings from Last 3 Encounters:  04/09/22 233 lb (105.7 kg)  01/23/22 239 lb 6.4 oz (108.6 kg)  12/12/21 239 lb 11.2 oz (108.7 kg)      Physical Exam Vitals reviewed.  Constitutional:      General:  He is not in acute distress.    Appearance: Normal appearance. He is not diaphoretic.  HENT:     Head: Normocephalic and atraumatic.  Eyes:     General: No scleral icterus.    Conjunctiva/sclera: Conjunctivae normal.  Cardiovascular:     Rate and Rhythm: Normal rate and regular rhythm.     Pulses: Normal pulses.     Heart sounds: Normal heart sounds. No murmur heard. Pulmonary:     Effort: Pulmonary effort is normal. No respiratory distress.     Breath sounds: Normal breath sounds. No wheezing or rhonchi.  Musculoskeletal:     Cervical back: Neck supple.     Right lower leg: No edema.     Left lower leg: No edema.  Lymphadenopathy:     Cervical: No cervical adenopathy.  Skin:    General: Skin is warm and dry.     Findings: No rash.  Neurological:     Mental Status: He is alert and oriented to person, place, and time. Mental status is at baseline.  Psychiatric:        Behavior: Behavior normal.        Thought Content: Thought content normal.        Judgment: Judgment normal.      No results found for any visits on 04/09/22.  Assessment & Plan     1. Hyperlipidemia associated with type 2 diabetes mellitus ([place]) Will update lipids at his next appt Continue current regimen  2. Diabetes mellitus without complication (HCC) Chronic and stable Weight loss of 6 pounds via healthy diet and exercise  3. Hypertension associated with diabetes ([place]) Chronic and stable Continue current regimen  4. Wheezing Poorly controlled. Question his compliance with medications - Ambulatory referral to Allergy or Pulmonology - Ambulatory referral to Sleep Studies  5. Morbid obesity ([place])  - Ambulatory referral to Sleep studies  6. Tobacco use disorder, continuous  - Ambulatory referral to pulmonology  7. Other headache syndrome, OSA Could be due to allergic rhinitis Continue symptomatic tx for congestion/saline nasal rinse, Flonase, zyrtec - ibuprofen (ADVIL) 600 MG tablet; Take  1 tablet (600 mg total) by mouth every 8 (eight) hours as needed.  Dispense: 30 tablet; Refill: 0 Only as needed with tylenol and with meals or PPI Risks of bleeding were discussed in details. Encouraged to use topicals vs oral anti-inflammatory  - Ambulatory referral to Sleep studies  8. Snoring  - Ambulatory referral to Sleep studies/pulmonology  Promised to do eye exam soon Fu in 2 mo/might need to do physical with all labs and gaps?  The patient was advised to call back or seek an in-person evaluation if the symptoms worsen or if the condition fails to improve as anticipated.  I discussed the assessment and treatment plan with the patient. The patient was provided an opportunity to ask questions and all were answered. The patient agreed with the plan and demonstrated an understanding of the instructions.  The entirety of the information documented in the History of Present  Illness, Review of Systems and Physical Exam were personally obtained by me. Portions of this information were initially documented by the CMA and reviewed by me for thoroughness and accuracy.  Portions of this note were created using dictation software and may contain typographical errors.       Total encounter time more than 30 minutes  Greater than 50% was spent in counseling and coordination of care with the patient     [facility] [phone] (phone) [phone] (fax)  [place]

## 2022-04-24 ENCOUNTER — Other Ambulatory Visit: Payer: Self-pay | Admitting: Family Medicine

## 2022-04-24 DIAGNOSIS — E119 Type 2 diabetes mellitus without complications: Secondary | ICD-10-CM

## 2022-05-23 ENCOUNTER — Institutional Professional Consult (permissible substitution): Payer: BC Managed Care – PPO | Admitting: Pulmonary Disease

## 2022-06-06 ENCOUNTER — Ambulatory Visit: Payer: BC Managed Care – PPO | Admitting: Physician Assistant

## 2022-06-06 NOTE — Progress Notes (Deleted)
Established patient visit   Patient: Austin Serrano   DOB: 1975/12/03   46 y.o. Male  MRN: [id number] Visit Date: 06/06/2022  Today's healthcare provider: [name], PA-C   No chief complaint on file.  Subjective    HPI  Diabetes Mellitus Type II, follow-up  Lab Results  Component Value Date   HGBA1C 5.6 11/11/2021   HGBA1C 5.6 08/07/2020   HGBA1C 5.8 (A) 04/26/2020   Last seen for diabetes 2 months ago.  Management since then includes continuing the same treatment.  Home blood sugar records: {diabetes glucometry results:16657} Most Recent Eye Exam: ***  --------------------------------------------------------------------------------------------------- Hypertension, follow-up  BP Readings from Last 3 Encounters:  04/09/22 123/69  01/23/22 129/79  12/12/21 109/70   Wt Readings from Last 3 Encounters:  04/09/22 233 lb (105.7 kg)  01/23/22 239 lb 6.4 oz (108.6 kg)  12/12/21 239 lb 11.2 oz (108.7 kg)     He was last seen for hypertension 2 months ago.  Management since that visit includes; Continue current regimen.  Outside blood pressures are {enter patient reported home BP, or 'not being checked':1}.  --------------------------------------------------------------------------------------------------- Lipid/Cholesterol, follow-up  Last Lipid Panel: Lab Results  Component Value Date   CHOL 211 (H) 10/31/2021   LDLCALC 142 (H) 10/31/2021   HDL 50 10/31/2021   TRIG 108 10/31/2021    He was last seen for this 7 months ago.  Management since that visit includes; Continue current regimen.  Last metabolic panel Lab Results  Component Value Date   GLUCOSE 72 10/31/2021   NA 143 10/31/2021   K 4.5 10/31/2021   BUN 15 10/31/2021   CREATININE 1.01 10/31/2021   EGFR 95 10/31/2021   GFRNONAA 80 04/26/2020   CALCIUM 9.7 10/31/2021   AST 24 10/31/2021   ALT 27 10/31/2021   The 10-year ASCVD risk score (Arnett DK, et al., 2019) is:  18.8%  ---------------------------------------------------------------------------------------------------   Medications: Outpatient Medications Prior to Visit  Medication Sig   albuterol (VENTOLIN HFA) 108 (90 Base) MCG/ACT inhaler INHALE 2 PUFFS INTO THE LUNGS EVERY 6 HOURS AS NEEDED FOR WHEEZING OR SHORTNESS OF BREATH   amLODipine (NORVASC) 5 MG tablet TAKE 1 TABLET(5 MG) BY MOUTH DAILY   atorvastatin (LIPITOR) 10 MG tablet TAKE 1 TABLET(10 MG) BY MOUTH DAILY   blood glucose meter kit and supplies Dispense based on patient and insurance preference. Use up to four times daily as directed. (FOR ICD-10 E10.9, E11.9).   fluticasone-salmeterol (ADVAIR) 100-50 MCG/ACT AEPB Inhale 1 puff into the lungs 2 (two) times daily.   ibuprofen (ADVIL) 600 MG tablet Take 1 tablet (600 mg total) by mouth every 8 (eight) hours as needed.   ibuprofen (ADVIL) 800 MG tablet Take 1 tablet (800 mg total) by mouth every 8 (eight) hours as needed.   omeprazole (PRILOSEC) 20 MG capsule Take 1 capsule (20 mg total) by mouth daily. (Patient not taking: Reported on 04/09/2022)   terbinafine (LAMISIL) 250 MG tablet Take 1 tablet (250 mg total) by mouth daily. (Patient not taking: Reported on 01/23/2022)   No facility-administered medications prior to visit.    Review of Systems  Constitutional:  Negative for appetite change, chills and fever.  Respiratory:  Negative for chest tightness, shortness of breath and wheezing.   Cardiovascular:  Negative for chest pain and palpitations.  Gastrointestinal:  Negative for abdominal pain, nausea and vomiting.    {Labs  Heme  Chem  Endocrine  Serology  Results Review (optional):23779}   Objective  There were no vitals taken for this visit. {Show previous vital signs (optional):23777}  Physical Exam  ***  No results found for any visits on 06/06/22.  Assessment & Plan     ***  No follow-ups on file.      {provider attestation***:1}   [name], Hershal Coria   [facility] [address] [phone] (phone) [phone] (fax)  [place]

## 2022-06-17 NOTE — Progress Notes (Deleted)
New Patient Note  RE: Austin Serrano MRN: [id number] DOB: February 05, 1976 Date of Office Visit: 06/18/2022  Consult requested by: [name], PA-C Primary care provider: [name], PA-C  Chief Complaint: No chief complaint on file.  History of Present Illness: I had the pleasure of seeing Austin Serrano for initial evaluation at the [facility] [place] [facility] [place] on 06/17/2022. He is a 46 y.o. male, who is referred here by [name], PA-C for the evaluation of wheezing.  He reports symptoms of *** chest tightness, shortness of breath, coughing, wheezing, nocturnal awakenings for *** years. Current medications include *** which help. He reports *** using aerochamber with inhalers. He tried the following inhalers: ***. Main triggers are ***allergies, infections, weather changes, smoke, exercise, pet exposure. In the last month, frequency of symptoms: ***x/week. Frequency of nocturnal symptoms: ***x/month. Frequency of SABA use: ***x/week. Interference with physical activity: ***. Sleep is ***disturbed. In the last 12 months, emergency room visits/urgent care visits/doctor office visits or hospitalizations due to respiratory issues: ***. In the last 12 months, oral steroids courses: ***. Lifetime history of hospitalization for respiratory issues: ***. Prior intubations: ***. Asthma was diagnosed at age *** by ***. History of pneumonia: ***. He was evaluated by allergist ***pulmonologist in the past. Smoking exposure: ***. Up to date with flu vaccine: ***. Up to date with pneumonia vaccine: ***. Up to date with COVID-19 vaccine: ***. Prior Covid-19 infection: ***. History of reflux: ***.  Assessment and Plan: Austin Serrano is a 46 y.o. male with: No problem-specific Assessment & Plan notes found for this encounter.  No follow-ups on file.  No orders of the defined types were placed in this encounter.  Lab Orders  No laboratory test(s) ordered today    Other allergy  screening: Asthma: {Blank single:19197::"yes","no"} Rhino conjunctivitis: {Blank single:19197::"yes","no"} Food allergy: {Blank single:19197::"yes","no"} Medication allergy: {Blank single:19197::"yes","no"} Hymenoptera allergy: {Blank single:19197::"yes","no"} Urticaria: {Blank single:19197::"yes","no"} Eczema:{Blank single:19197::"yes","no"} History of recurrent infections suggestive of immunodeficency: {Blank single:19197::"yes","no"}  Diagnostics: Spirometry:  Tracings reviewed. His effort: {Blank single:19197::"Good reproducible efforts.","It was hard to get consistent efforts and there is a question as to whether this reflects a maximal maneuver.","Poor effort, data can not be interpreted."} FVC: ***L FEV1: ***L, ***% predicted FEV1/FVC ratio: ***% Interpretation: {Blank single:19197::"Spirometry consistent with mild obstructive disease","Spirometry consistent with moderate obstructive disease","Spirometry consistent with severe obstructive disease","Spirometry consistent with possible restrictive disease","Spirometry consistent with mixed obstructive and restrictive disease","Spirometry uninterpretable due to technique","Spirometry consistent with normal pattern","No overt abnormalities noted given today's efforts"}.  Please see scanned spirometry results for details.  Skin Testing: {Blank single:19197::"Select foods","Environmental allergy panel","Environmental allergy panel and select foods","Food allergy panel","None","Deferred due to recent antihistamines use"}. *** Results discussed with patient/family.   Past Medical History: Patient Active Problem List   Diagnosis Date Noted  . Cluster headache, not intractable 12/12/2021  . Morbid obesity ([place]) 12/11/2021  . Diabetes mellitus without complication ([place]) 18/56/3149  . Hypertension associated with diabetes ([place]) 12/11/2021  . Asthma 01/05/2020  . Photophobia of both eyes 01/05/2020  . Hyperlipidemia associated with type 2  diabetes mellitus ([place]) 01/05/2020  . Vitamin D insufficiency 01/05/2020  . Wheezing 01/05/2020  . Cyst of skin- bilateral feet  01/05/2020  . Screening for blood or protein in urine 01/05/2020  . Tinea pedis of both feet 01/05/2020  . Localized bacterial skin infection- feet bilateral.  01/05/2020  . Hyperpigmentation of skin- bilateral feet 01/05/2020   Past Medical History:  Diagnosis Date  . Asthma   . Diabetes mellitus without complication ([place])  Past Surgical History: No past surgical history on file. Medication List:  Current Outpatient Medications  Medication Sig Dispense Refill  . albuterol (VENTOLIN HFA) 108 (90 Base) MCG/ACT inhaler INHALE 2 PUFFS INTO THE LUNGS EVERY 6 HOURS AS NEEDED FOR WHEEZING OR SHORTNESS OF BREATH 6.7 g 0  . amLODipine (NORVASC) 5 MG tablet TAKE 1 TABLET(5 MG) BY MOUTH DAILY 30 tablet 0  . atorvastatin (LIPITOR) 10 MG tablet TAKE 1 TABLET(10 MG) BY MOUTH DAILY 30 tablet 2  . blood glucose meter kit and supplies Dispense based on patient and insurance preference. Use up to four times daily as directed. (FOR ICD-10 E10.9, E11.9). 1 each 0  . fluticasone-salmeterol (ADVAIR) 100-50 MCG/ACT AEPB Inhale 1 puff into the lungs 2 (two) times daily. 1 each 3  . ibuprofen (ADVIL) 600 MG tablet Take 1 tablet (600 mg total) by mouth every 8 (eight) hours as needed. 30 tablet 0  . ibuprofen (ADVIL) 800 MG tablet Take 1 tablet (800 mg total) by mouth every 8 (eight) hours as needed. 30 tablet 0  . omeprazole (PRILOSEC) 20 MG capsule Take 1 capsule (20 mg total) by mouth daily. (Patient not taking: Reported on 04/09/2022) 30 capsule 3  . terbinafine (LAMISIL) 250 MG tablet Take 1 tablet (250 mg total) by mouth daily. (Patient not taking: Reported on 01/23/2022) 14 tablet 0   No current facility-administered medications for this visit.   Allergies: Allergies  Allergen Reactions  . Other     Seasonal allergies Cats   Social History: Social History    Socioeconomic History  . Marital status: Married    Spouse name: Not on file  . Number of children: Not on file  . Years of education: Not on file  . Highest education level: Not on file  Occupational History  . Not on file  Tobacco Use  . Smoking status: Every Day    Packs/day: 2.00    Types: Cigarettes  . Smokeless tobacco: Never  Substance and Sexual Activity  . Alcohol use: Yes    Comment: Occ  . Drug use: No  . Sexual activity: Yes  Other Topics Concern  . Not on file  Social History Narrative  . Not on file   Social Determinants of Health   Financial Resource Strain: Not on file  Food Insecurity: Not on file  Transportation Needs: Not on file  Physical Activity: Not on file  Stress: Not on file  Social Connections: Not on file   Lives in a ***. Smoking: *** Occupation: ***  Environmental HistoryFreight forwarder in the house: Estate agent in the family room: {Blank single:19197::"yes","no"} Carpet in the bedroom: {Blank single:19197::"yes","no"} Heating: {Blank single:19197::"electric","gas","heat pump"} Cooling: {Blank single:19197::"central","window","heat pump"} Pet: {Blank single:19197::"yes ***","no"}  Family History: Family History  Problem Relation Age of Onset  . Breast cancer Mother   . Hypertension Mother    Problem                               Relation Asthma                                   *** Eczema                                *** Food allergy                          ***  Allergic rhino conjunctivitis     ***  Review of Systems  Constitutional:  Negative for appetite change, chills, fever and unexpected weight change.  HENT:  Negative for congestion and rhinorrhea.   Eyes:  Negative for itching.  Respiratory:  Negative for cough, chest tightness, shortness of breath and wheezing.   Cardiovascular:  Negative for chest pain.  Gastrointestinal:  Negative for abdominal pain.  Genitourinary:   Negative for difficulty urinating.  Skin:  Negative for rash.  Neurological:  Negative for headaches.   Objective: There were no vitals taken for this visit. There is no height or weight on file to calculate BMI. Physical Exam Vitals and nursing note reviewed.  Constitutional:      Appearance: Normal appearance. He is well-developed.  HENT:     Head: Normocephalic and atraumatic.     Right Ear: Tympanic membrane and external ear normal.     Left Ear: Tympanic membrane and external ear normal.     Nose: Nose normal.     Mouth/Throat:     Mouth: Mucous membranes are moist.     Pharynx: Oropharynx is clear.  Eyes:     Conjunctiva/sclera: Conjunctivae normal.  Cardiovascular:     Rate and Rhythm: Normal rate and regular rhythm.     Heart sounds: Normal heart sounds. No murmur heard.    No friction rub. No gallop.  Pulmonary:     Effort: Pulmonary effort is normal.     Breath sounds: Normal breath sounds. No wheezing, rhonchi or rales.  Musculoskeletal:     Cervical back: Neck supple.  Skin:    General: Skin is warm.     Findings: No rash.  Neurological:     Mental Status: He is alert and oriented to person, place, and time.  Psychiatric:        Behavior: Behavior normal.  The plan was reviewed with the patient/family, and all questions/concerned were addressed.  It was my pleasure to see Austin Serrano today and participate in his care. Please feel free to contact me with any questions or concerns.  Sincerely,  Rexene Alberts, DO Allergy & Immunology  [facility] office: [place] office: [phone]

## 2022-06-18 ENCOUNTER — Ambulatory Visit: Payer: Self-pay | Admitting: Allergy

## 2023-05-06 ENCOUNTER — Emergency Department
Admission: EM | Admit: 2023-05-06 | Discharge: 2023-05-06 | Disposition: A | Discharge status: Home / Self Care | Payer: BC Managed Care – PPO | Attending: Emergency Medicine | Admitting: Emergency Medicine

## 2023-05-06 ENCOUNTER — Encounter: Payer: Self-pay | Admitting: *Deleted

## 2023-05-06 ENCOUNTER — Other Ambulatory Visit: Payer: Self-pay

## 2023-05-06 DIAGNOSIS — J029 Acute pharyngitis, unspecified: Secondary | ICD-10-CM | POA: Insufficient documentation

## 2023-05-06 DIAGNOSIS — E119 Type 2 diabetes mellitus without complications: Secondary | ICD-10-CM | POA: Insufficient documentation

## 2023-05-06 DIAGNOSIS — Z20822 Contact with and (suspected) exposure to covid-19: Secondary | ICD-10-CM | POA: Diagnosis not present

## 2023-05-06 DIAGNOSIS — R109 Unspecified abdominal pain: Secondary | ICD-10-CM | POA: Diagnosis not present

## 2023-05-06 DIAGNOSIS — R112 Nausea with vomiting, unspecified: Secondary | ICD-10-CM | POA: Insufficient documentation

## 2023-05-06 DIAGNOSIS — F17200 Nicotine dependence, unspecified, uncomplicated: Secondary | ICD-10-CM | POA: Diagnosis not present

## 2023-05-06 DIAGNOSIS — R059 Cough, unspecified: Secondary | ICD-10-CM | POA: Diagnosis not present

## 2023-05-06 DIAGNOSIS — R051 Acute cough: Secondary | ICD-10-CM | POA: Insufficient documentation

## 2023-05-06 DIAGNOSIS — R9431 Abnormal electrocardiogram [ECG] [EKG]: Secondary | ICD-10-CM | POA: Diagnosis not present

## 2023-05-06 DIAGNOSIS — R197 Diarrhea, unspecified: Secondary | ICD-10-CM | POA: Insufficient documentation

## 2023-05-06 LAB — COMPREHENSIVE METABOLIC PANEL
ALT: 20 U/L (ref 0–44)
AST: 18 U/L (ref 15–41)
Albumin: 3.7 g/dL (ref 3.5–5.0)
Alkaline Phosphatase: 49 U/L (ref 38–126)
Anion gap: 8 (ref 5–15)
BUN: 18 mg/dL (ref 6–20)
CO2: 27 mmol/L (ref 22–32)
Calcium: 8.8 mg/dL — ABNORMAL LOW (ref 8.9–10.3)
Chloride: 103 mmol/L (ref 98–111)
Creatinine, Ser: 0.93 mg/dL (ref 0.61–1.24)
GFR, Estimated: >60 mL/min (ref >60)
Glucose, Bld: 93 mg/dL (ref 70–99)
Potassium: 4 mmol/L (ref 3.5–5.1)
Sodium: 138 mmol/L (ref 135–145)
Total Bilirubin: 0.5 mg/dL (ref 0.3–1.2)
Total Protein: 7.6 g/dL (ref 6.5–8.1)

## 2023-05-06 LAB — LIPASE, BLOOD: Lipase: 34 U/L (ref 11–51)

## 2023-05-06 LAB — RESP PANEL BY RT-PCR (RSV, FLU A&B, COVID)  RVPGX2
Influenza A by PCR: NEGATIVE
Influenza B by PCR: NEGATIVE
Resp Syncytial Virus by PCR: NEGATIVE
SARS Coronavirus 2 by RT PCR: NEGATIVE

## 2023-05-06 LAB — CBC
HCT: 43.4 % (ref 39.0–52.0)
Hemoglobin: 14.3 g/dL (ref 13.0–17.0)
MCH: 29.9 pg (ref 26.0–34.0)
MCHC: 32.9 g/dL (ref 30.0–36.0)
MCV: 90.6 fL (ref 80.0–100.0)
Platelets: 269 10*3/uL (ref 150–400)
RBC: 4.79 MIL/uL (ref 4.22–5.81)
RDW: 13.4 % (ref 11.5–15.5)
WBC: 8 10*3/uL (ref 4.0–10.5)
nRBC: 0 % (ref 0.0–0.2)

## 2023-05-06 MED ORDER — ONDANSETRON 4 MG PO TBDP
4.0000 mg | ORAL_TABLET | Freq: Once | ORAL | Status: AC
  Administered 2023-05-06: 4 mg via ORAL
  Filled 2023-05-06: qty 1

## 2023-05-06 MED ORDER — ONDANSETRON 4 MG PO TBDP: 4.0000 mg | ORAL_TABLET | Freq: Three times a day (TID) | ORAL | 0 refills | Status: AC | PRN

## 2023-05-06 MED ORDER — ACETAMINOPHEN 500 MG PO TABS
1000.0000 mg | ORAL_TABLET | Freq: Once | ORAL | Status: AC
  Administered 2023-05-06: 1000 mg via ORAL
  Filled 2023-05-06: qty 2

## 2023-05-06 NOTE — Discharge Instructions (Signed)
You were seen in the emergency department for abdominal pain associated with nausea, diarrhea and a cough with sore throat.  You had COVID testing done, this result will come to your phone and you can check on MyChart.  Your lab work was normal.  You had normal electrolytes and no white count.  Stay hydrated and drink plenty of fluids.  Return to the emergency department for any signs of dehydration or worsening symptoms.  Pain control:  Acetaminophen (tylenol) - You can take 2 extra strength tablets (1000 mg) every 6 hours as needed for pain/fever.  Make sure you eat food/drink water when taking these medications.  zofran (ondansetron) - nausea medication, take 1 tablet every 8 hours as needed for nausea/vomiting.

## 2023-05-06 NOTE — ED Provider Notes (Signed)
[facility] Provider Note    Event Date/Time   First MD Initiated Contact with Patient 05/06/23 0848     (approximate)   History   Abdominal Pain   HPI  Austin Serrano is a 47 y.o. male presents to the emergency department with 2 days of not feeling well.  Endorses cough, sore throat, nausea, vomiting and diarrhea.  Endorses abdominal cramping that is worse on the left side.  No fever or chills.  Does endorse tobacco use.  States that he has a history of diabetes but his A1c has improved so he is no longer on diabetic medication.  No blood in his stool.  No recent travel.  Not on anticoagulation.  No fever.     Physical Exam   Triage Vital Signs: ED Triage Vitals  Encounter Vitals Group     BP 05/06/23 0903 131/89     Systolic BP Percentile --      Diastolic BP Percentile --      Pulse Rate 05/06/23 0903 89     Resp 05/06/23 0903 18     Temp 05/06/23 0903 97.7 F (36.5 C)     Temp Source 05/06/23 0903 Oral     SpO2 05/06/23 0903 94 %     Weight 05/06/23 0853 233 lb 0.4 oz (105.7 kg)     Height 05/06/23 0853 5\' 6"  (1.676 m)     Head Circumference --      Peak Flow --      Pain Score 05/06/23 0835 7     Pain Loc --      Pain Education --      Exclude from Growth Chart --     Most recent vital signs: Vitals:   05/06/23 0903  BP: 131/89  Pulse: 89  Resp: 18  Temp: 97.7 F (36.5 C)  SpO2: 94%    Physical Exam Constitutional:      Appearance: He is well-developed.  HENT:     Head: Atraumatic.  Eyes:     Conjunctiva/sclera: Conjunctivae normal.     Pupils: Pupils are equal, round, and reactive to light.  Cardiovascular:     Rate and Rhythm: Regular rhythm.  Pulmonary:     Effort: No respiratory distress.  Abdominal:     General: Abdomen is flat.     Tenderness: There is abdominal tenderness in the left upper quadrant. There is no right CVA tenderness, left CVA tenderness, guarding or rebound. Negative signs include Murphy's sign  and McBurney's sign.  Musculoskeletal:     Cervical back: Normal range of motion.  Skin:    General: Skin is warm.     Capillary Refill: Capillary refill takes less than 2 seconds.  Neurological:     Mental Status: He is alert. Mental status is at baseline.     IMPRESSION / MDM / ASSESSMENT AND PLAN / ED COURSE  I reviewed the triage vital signs and the nursing notes.  Differential diagnosis including electrolyte abnormality, dehydration, COVID, influenza  EKG  I, Corena Herter, the attending physician, personally viewed and interpreted this ECG.   Rate: Normal  Rhythm: Normal sinus  Axis: Normal  Intervals: Normal  ST&T Change: None  No tachycardic or bradycardic dysrhythmias while on cardiac telemetry.  LABS (all labs ordered are listed, but only abnormal results are displayed) Labs interpreted as -    Labs Reviewed  COMPREHENSIVE METABOLIC PANEL - Abnormal; Notable for the following components:      Result Value  Calcium 8.8 (*)    All other components within normal limits  RESP PANEL BY RT-PCR (RSV, FLU A&B, COVID)  RVPGX2  LIPASE, BLOOD  CBC     MDM  No significant electrolyte abnormalities.  No leukocytosis or anemia.  Creatinine at baseline.  No signs of significant dehydration.  COVID influenza testing are currently in process, patient states that he can check on his MyChart.  Low suspicion for ACS, no chest pain.  EKG without acute findings.  Given a prescription for antiemetics.  Discussed close follow-up with primary care provider.  Given return precautions for worsening symptoms.  Most likely with a viral illness.     PROCEDURES:  Critical Care performed: No  Procedures  Patient's presentation is most consistent with acute presentation with potential threat to life or bodily function.   MEDICATIONS ORDERED IN ED: Medications  ondansetron (ZOFRAN-ODT) disintegrating tablet 4 mg (4 mg Oral Given 05/06/23 0912)  acetaminophen (TYLENOL) tablet  1,000 mg (1,000 mg Oral Given 05/06/23 0912)    FINAL CLINICAL IMPRESSION(S) / ED DIAGNOSES   Final diagnoses:  Nausea vomiting and diarrhea  Acute cough     Rx / DC Orders   ED Discharge Orders          Ordered    ondansetron (ZOFRAN-ODT) 4 MG disintegrating tablet  Every 8 hours PRN        05/06/23 0900             Note:  This document was prepared using Dragon voice recognition software and may include unintentional dictation errors.   Corena Herter, MD 05/06/23 [phone]

## 2023-05-06 NOTE — ED Notes (Signed)
See triage notes. Patient began having abdominal pain with nausea and diarrhea yesterday. Patient began having a cough and sore throat today.

## 2023-05-06 NOTE — ED Triage Notes (Signed)
Upper abdominal pain which began yesterday.  Pt has had nausea and cold sweats with this.

## 2023-06-09 ENCOUNTER — Telehealth: Payer: Self-pay

## 2023-06-09 NOTE — Telephone Encounter (Signed)
Transition Care Management Follow-up Telephone Call Date of discharge and from where: 05/06/2023 [facility] How have you been since you were released from the hospital? Patient stated he is feeling much better. Any questions or concerns? No  Items Reviewed: Did the pt receive and understand the discharge instructions provided? Yes  Medications obtained and verified? Yes  Other? No  Any new allergies since your discharge? No  Dietary orders reviewed? Yes Do you have support at home? Yes   Follow up appointments reviewed:  PCP Hospital f/u appt confirmed? No  Scheduled to see  on  @ . Specialist Hospital f/u appt confirmed? No  Scheduled to see  on  @ . Are transportation arrangements needed? No  If their condition worsens, is the pt aware to call PCP or go to the Emergency Dept.? Yes Was the patient provided with contact information for the PCP's office or ED? Yes Was to pt encouraged to call back with questions or concerns? Yes   Tericka Devincenzi [facility]  [facility] Guide Direct Dial: [phone]  Website: [email or web address]
